# Patient Record
Sex: Male | Born: 1975 | Race: Black or African American | Hispanic: No | Marital: Married | Smoking: Former smoker
Health system: Southern US, Community
[De-identification: ages and names within clinical notes are randomized; demographics above are authoritative.]

## PROBLEM LIST (undated history)

## (undated) DIAGNOSIS — J45909 Unspecified asthma, uncomplicated: Secondary | ICD-10-CM

## (undated) DIAGNOSIS — G43909 Migraine, unspecified, not intractable, without status migrainosus: Secondary | ICD-10-CM

## (undated) DIAGNOSIS — M25559 Pain in unspecified hip: Secondary | ICD-10-CM

## (undated) DIAGNOSIS — I10 Essential (primary) hypertension: Secondary | ICD-10-CM

## (undated) DIAGNOSIS — E7800 Pure hypercholesterolemia, unspecified: Secondary | ICD-10-CM

## (undated) DIAGNOSIS — M549 Dorsalgia, unspecified: Secondary | ICD-10-CM

## (undated) DIAGNOSIS — M545 Low back pain, unspecified: Secondary | ICD-10-CM

## (undated) DIAGNOSIS — J4520 Mild intermittent asthma, uncomplicated: Secondary | ICD-10-CM

## (undated) HISTORY — DX: Pain in unspecified hip: M25.559

## (undated) HISTORY — PX: INCISION AND DRAINAGE: SHX5863

---

## 2011-05-02 ENCOUNTER — Emergency Department (HOSPITAL_COMMUNITY)
Admission: EM | Admit: 2011-05-02 | Discharge: 2011-05-03 | Disposition: A | Discharge status: Home / Self Care | Payer: Medicaid Other | Attending: Emergency Medicine | Admitting: Emergency Medicine

## 2011-05-02 ENCOUNTER — Encounter: Payer: Self-pay | Admitting: Emergency Medicine

## 2011-05-02 DIAGNOSIS — M549 Dorsalgia, unspecified: Secondary | ICD-10-CM | POA: Insufficient documentation

## 2011-05-02 DIAGNOSIS — G8929 Other chronic pain: Secondary | ICD-10-CM | POA: Insufficient documentation

## 2011-05-02 HISTORY — DX: Dorsalgia, unspecified: M54.9

## 2011-05-02 NOTE — ED Notes (Signed)
Pt alert, nad, c/o low back pain, chronic in nature, denies recent trauma or injury, ambulates to triage, PMS intact

## 2011-05-02 NOTE — ED Notes (Signed)
Pt states that he has chronic back pain that has began to hurt him again. Patient states he had numbness yesterday in his left leg and hip that has become painful today. Patient is also c/o pain in the back of his head and neck. Patient states that it hurts to walk a lot and can't lift his right leg very much. Patient takes flexeril for pain.

## 2011-05-03 MED ORDER — PREDNISONE 20 MG PO TABS
60.0000 mg | ORAL_TABLET | Freq: Once | ORAL | Status: AC
  Administered 2011-05-03: 60 mg via ORAL
  Filled 2011-05-03: qty 3

## 2011-05-03 MED ORDER — NAPROXEN 500 MG PO TABS: 500.0000 mg | ORAL_TABLET | Freq: Two times a day (BID) | ORAL | Status: AC

## 2011-05-03 MED ORDER — PREDNISONE 50 MG PO TABS: ORAL_TABLET | ORAL | Status: AC

## 2011-05-03 MED ORDER — ALBUTEROL SULFATE HFA 108 (90 BASE) MCG/ACT IN AERS: 2.0000 | INHALATION_SPRAY | RESPIRATORY_TRACT | Status: DC | PRN

## 2011-05-03 NOTE — ED Provider Notes (Signed)
History     CSN: [id number] Arrival date & time: 05/02/2011 10:16 PM   First MD Initiated Contact with Patient 05/02/11 2343      Chief Complaint  Patient presents with  . Back Pain    (Consider location/radiation/quality/duration/timing/severity/associated sxs/prior treatment) HPI  Patient states he recently moved to [place] from [place] and has not established primary care presents to emergency department complaining of chronic back pain stating that over the last 2-3 days a flare in his chronic back pain. Patient states the pain is similar to the flares of his chronic back pain in the past. Patient denies any numbness, tingling, or weakness of lower extremities, saddle seat paresthesias, loss of bowel or bladder function, abdominal pain, or urinary symptoms. Patient has not taken anything for pain at home prior to arrival. Patient states his primary care provider in [place] had talked about sending him to a back specialist for further evaluation of his back pain. Patient states he did not do this prior to moving to [place]. Patient states pain is aggravated by movement and radiates from left lower back into the left hip. Patient states pain is improved by lying still. Patient denies any new injury.  Patient is also requesting a refill of his albuterol inhaler stating he uses it on PRN basis and has run out. No wheezing or coughing complaints at this time Past Medical History  Diagnosis Date  . Back pain     History reviewed. No pertinent past surgical history.  No family history on file.  History  Substance Use Topics  . Smoking status: Not on file  . Smokeless tobacco: Not on file  . Alcohol Use:       Review of Systems  All other systems reviewed and are negative.    Allergies  Motrin  Home Medications   Current Outpatient Rx  Name Route Sig Dispense Refill  . ALBUTEROL SULFATE HFA 108 (90 BASE) MCG/ACT IN AERS Inhalation Inhale 2 puffs  into the lungs every 6 (six) hours as needed.      . ALBUTEROL SULFATE HFA 108 (90 BASE) MCG/ACT IN AERS Inhalation Inhale 2 puffs into the lungs every 4 (four) hours as needed for wheezing. 1 Inhaler 0  . NAPROXEN 500 MG PO TABS Oral Take 1 tablet (500 mg total) by mouth 2 (two) times daily. 30 tablet 0  . PREDNISONE 50 MG PO TABS  Take 1 tablet by mouth daily for total of 5 days. 5 tablet 0    BP 166/115  Pulse 101  Temp(Src) 98.9 F (37.2 C) (Oral)  Resp 18  SpO2 100%  Physical Exam  Nursing note and vitals reviewed. Constitutional: He is oriented to person, place, and time. He appears well-developed and well-nourished. No distress.  HENT:  Head: Normocephalic and atraumatic.  Eyes: Conjunctivae are normal.  Neck: Normal range of motion. Neck supple.  Cardiovascular: Normal rate, regular rhythm, normal heart sounds and intact distal pulses.  Exam reveals no gallop and no friction rub.   No murmur heard. Pulmonary/Chest: Effort normal and breath sounds normal. No respiratory distress. He has no wheezes. He has no rales. He exhibits no tenderness.  Abdominal: Bowel sounds are normal. He exhibits no distension and no mass. There is no tenderness. There is no rebound and no guarding.  Musculoskeletal: Normal range of motion. He exhibits tenderness. He exhibits no edema.       Mild TTP of soft tissue of left lower back but no skin changes or  crepitous.   Neurological: He is alert and oriented to person, place, and time. He has normal reflexes.  Skin: Skin is warm and dry. No rash noted. He is not diaphoretic. No erythema.  Psychiatric: He has a normal mood and affect.    ED Course  Procedures (including critical care time)  Labs Reviewed - No data to display No results found.   1. Chronic back pain       MDM  Patient with history of chronic back pain without any signs or symptoms of central cord compression or cauda equina. No injury. Patient is ambulating without  difficulty. Patient is neurovascularly intact in bilateral lower extremities with equal reflexes.        Jenness [place], [place] 05/03/11 [phone]

## 2011-05-03 NOTE — ED Notes (Signed)
Patient given discharge instructions, information, prescriptions, and diet order. Patient states that they adequately understand discharge information given and to return to ED if symptoms return or worsen.    Patient given 3 rx. -

## 2011-05-03 NOTE — ED Provider Notes (Signed)
Medical screening examination/treatment/procedure(s) were performed by non-physician practitioner and as supervising physician I was immediately available for consultation/collaboration.  Olivia Mackie, MD 05/03/11 [phone]

## 2012-09-07 ENCOUNTER — Emergency Department (HOSPITAL_COMMUNITY): Payer: Medicaid Other

## 2012-09-07 ENCOUNTER — Encounter (HOSPITAL_COMMUNITY): Payer: Self-pay | Admitting: *Deleted

## 2012-09-07 ENCOUNTER — Emergency Department (HOSPITAL_COMMUNITY)
Admission: EM | Admit: 2012-09-07 | Discharge: 2012-09-07 | Disposition: A | Discharge status: Home / Self Care | Payer: Medicaid Other | Attending: Emergency Medicine | Admitting: Emergency Medicine

## 2012-09-07 DIAGNOSIS — Z862 Personal history of diseases of the blood and blood-forming organs and certain disorders involving the immune mechanism: Secondary | ICD-10-CM | POA: Insufficient documentation

## 2012-09-07 DIAGNOSIS — R112 Nausea with vomiting, unspecified: Secondary | ICD-10-CM | POA: Insufficient documentation

## 2012-09-07 DIAGNOSIS — J45909 Unspecified asthma, uncomplicated: Secondary | ICD-10-CM | POA: Insufficient documentation

## 2012-09-07 DIAGNOSIS — I10 Essential (primary) hypertension: Secondary | ICD-10-CM | POA: Insufficient documentation

## 2012-09-07 DIAGNOSIS — Z8639 Personal history of other endocrine, nutritional and metabolic disease: Secondary | ICD-10-CM | POA: Insufficient documentation

## 2012-09-07 DIAGNOSIS — R079 Chest pain, unspecified: Secondary | ICD-10-CM | POA: Insufficient documentation

## 2012-09-07 DIAGNOSIS — Z79899 Other long term (current) drug therapy: Secondary | ICD-10-CM | POA: Insufficient documentation

## 2012-09-07 DIAGNOSIS — Z8679 Personal history of other diseases of the circulatory system: Secondary | ICD-10-CM | POA: Insufficient documentation

## 2012-09-07 DIAGNOSIS — G8929 Other chronic pain: Secondary | ICD-10-CM | POA: Insufficient documentation

## 2012-09-07 DIAGNOSIS — R0602 Shortness of breath: Secondary | ICD-10-CM | POA: Insufficient documentation

## 2012-09-07 HISTORY — DX: Unspecified asthma, uncomplicated: J45.909

## 2012-09-07 HISTORY — DX: Migraine, unspecified, not intractable, without status migrainosus: G43.909

## 2012-09-07 HISTORY — DX: Essential (primary) hypertension: I10

## 2012-09-07 HISTORY — DX: Pure hypercholesterolemia, unspecified: E78.00

## 2012-09-07 LAB — BASIC METABOLIC PANEL
BUN: 17 mg/dL (ref 6–23)
CO2: 26 mEq/L (ref 19–32)
Chloride: 104 mEq/L (ref 96–112)
GFR calc non Af Amer: >90 mL/min (ref >90)
Glucose, Bld: 93 mg/dL (ref 70–99)
Potassium: 3.8 mEq/L (ref 3.5–5.1)
Sodium: 139 mEq/L (ref 135–145)

## 2012-09-07 LAB — POCT I-STAT TROPONIN I: Troponin i, poc: 0 ng/mL (ref 0.00–0.08)

## 2012-09-07 LAB — CBC
HCT: 37.7 % — ABNORMAL LOW (ref 39.0–52.0)
Hemoglobin: 12.8 g/dL — ABNORMAL LOW (ref 13.0–17.0)
MCHC: 34 g/dL (ref 30.0–36.0)
RBC: 4.83 MIL/uL (ref 4.22–5.81)

## 2012-09-07 MED ORDER — ONDANSETRON HCL 4 MG/2ML IJ SOLN
4.0000 mg | Freq: Once | INTRAMUSCULAR | Status: AC
  Administered 2012-09-07: 4 mg via INTRAVENOUS
  Filled 2012-09-07: qty 2

## 2012-09-07 MED ORDER — MORPHINE SULFATE 4 MG/ML IJ SOLN
4.0000 mg | Freq: Once | INTRAMUSCULAR | Status: AC
  Administered 2012-09-07: 4 mg via INTRAVENOUS
  Filled 2012-09-07: qty 1

## 2012-09-07 NOTE — ED Notes (Signed)
Pt states he is dizzy, placed on fall precautions.

## 2012-09-07 NOTE — ED Provider Notes (Signed)
History     CSN: [id number]  Arrival date & time 09/07/12  0107   First MD Initiated Contact with Patient 09/07/12 0126      Chief Complaint  Patient presents with  . Chest Pain    (Consider location/radiation/quality/duration/timing/severity/associated sxs/prior treatment) HPI History provided by patient. Has chronic chest pain and seems to be worse over the last 4 days. Located left chest, burning in quality and not radiating. Intermittently has some shortness of breath and nausea with vomiting once earlier today. No trauma. No fevers or cough. Was previously followed by cardiology in [place] but now lives in [place] area - he does not have a local primary care physician. No history of MI. Pain moderate severity and is present daily, not related to exertion, with no known alleviating factors. No leg pain or swelling. Patient is requesting referrals to primary care clinic. He has not had any recent stress testing Past Medical History  Diagnosis Date  . Back pain   . Hypertension   . Hypercholesteremia   . Migraine   . Asthma     History reviewed. No pertinent past surgical history.  History reviewed. No pertinent family history.  History  Substance Use Topics  . Smoking status: Never Smoker   . Smokeless tobacco: Not on file  . Alcohol Use: Yes     Comment: wine every so often      Review of Systems  Constitutional: Negative for fever and chills.  HENT: Negative for neck pain and neck stiffness.   Eyes: Negative for pain.  Respiratory: Negative for cough.   Cardiovascular: Positive for chest pain.  Gastrointestinal: Negative for abdominal pain.  Genitourinary: Negative for dysuria.  Musculoskeletal: Negative for back pain.  Skin: Negative for rash.  Neurological: Negative for headaches.  All other systems reviewed and are negative.    Allergies  Flexeril; Motrin; and Naproxen  Home Medications   Current Outpatient Rx  Name  Route  Sig  Dispense  Refill   . albuterol (PROVENTIL HFA;VENTOLIN HFA) 108 (90 BASE) MCG/ACT inhaler   Inhalation   Inhale 2 puffs into the lungs every 6 (six) hours as needed.           Marland Kitchen EXPIRED: albuterol (PROVENTIL HFA;VENTOLIN HFA) 108 (90 BASE) MCG/ACT inhaler   Inhalation   Inhale 2 puffs into the lungs every 4 (four) hours as needed for wheezing.   1 Inhaler   0     BP 148/95  Pulse 111  Temp(Src) 98.7 F (37.1 C) (Oral)  Resp 16  SpO2 99%  Physical Exam  Constitutional: He is oriented to person, place, and time. He appears well-developed and well-nourished.  HENT:  Head: Normocephalic and atraumatic.  Eyes: Conjunctivae and EOM are normal. Pupils are equal, round, and reactive to light.  Neck: Trachea normal. Neck supple. No thyromegaly present.  Cardiovascular: Normal rate, regular rhythm, S1 normal, S2 normal, intact distal pulses and normal pulses.     No systolic murmur is present   No diastolic murmur is present  Pulses:      Radial pulses are 2+ on the right side, and 2+ on the left side.  Pulmonary/Chest: Effort normal and breath sounds normal. No respiratory distress. He has no wheezes. He has no rhonchi. He has no rales.  No rash or crepitus  Abdominal: Soft. Normal appearance and bowel sounds are normal. There is no tenderness. There is no CVA tenderness and negative Murphy's sign.  Musculoskeletal: Normal range of motion. He exhibits no  edema.  BLE:s Calves nontender, no cords or erythema, negative Homans sign  Neurological: He is alert and oriented to person, place, and time. He has normal strength. No cranial nerve deficit or sensory deficit. GCS eye subscore is 4. GCS verbal subscore is 5. GCS motor subscore is 6.  Skin: Skin is warm and dry. No rash noted. He is not diaphoretic.  Psychiatric: His speech is normal.  Cooperative and appropriate    ED Course  Procedures (including critical care time)   Results for orders placed during the hospital encounter of 09/07/12  CBC       Result Value Range   WBC 4.3  4.0 - 10.5 K/uL   RBC 4.83  4.22 - 5.81 MIL/uL   Hemoglobin 12.8 (*) 13.0 - 17.0 g/dL   HCT 16.1 (*) 09.6 - 04.5 %   MCV 78.1  78.0 - 100.0 fL   MCH 26.5  26.0 - 34.0 pg   MCHC 34.0  30.0 - 36.0 g/dL   RDW 40.9  81.1 - 91.4 %   Platelets 244  150 - 400 K/uL  BASIC METABOLIC PANEL      Result Value Range   Sodium 139  135 - 145 mEq/L   Potassium 3.8  3.5 - 5.1 mEq/L   Chloride 104  96 - 112 mEq/L   CO2 26  19 - 32 mEq/L   Glucose, Bld 93  70 - 99 mg/dL   BUN 17  6 - 23 mg/dL   Creatinine, Ser 7.82  0.50 - 1.35 mg/dL   Calcium 9.4  8.4 - 95.6 mg/dL   GFR calc non Af Amer >90  >90 mL/min   GFR calc Af Amer >90  >90 mL/min  TROPONIN I      Result Value Range   Troponin I <0.30  <0.30 ng/mL  POCT I-STAT TROPONIN I      Result Value Range   Troponin i, poc 0.00  0.00 - 0.08 ng/mL   Comment 3            Dg Chest 2 View  09/07/2012  *RADIOLOGY REPORT*  Clinical Data: 37 year old male chest pain and hypertension.  CHEST - 2 VIEW  Comparison: None.  Findings: Lung volumes at the upper limits of normal.  Cardiac size and mediastinal contours are within normal limits.  Visualized tracheal air column is within normal limits.  Incidental EKG button artifact.  No pneumothorax, pulmonary edema, pleural effusion or confluent pulmonary opacity. No acute osseous abnormality identified.  IMPRESSION: No acute cardiopulmonary abnormality.   Original Report Authenticated By: Erskine Speed, M.D.      Date: 09/07/2012  Rate: 100  Rhythm: normal sinus rhythm  QRS Axis: normal  Intervals: normal  ST/T Wave abnormalities: nonspecific ST changes  Conduction Disutrbances:none  Narrative Interpretation:   Old EKG Reviewed: none available  Aspirin held for multiple NSAID allergies. IV morphine with pain improved. Plan outpatient followup with referral for stress testing and follow up primary care clinic  MDM  Chronic chest pain, constant and worse or lasts 4  days  Evaluated with EKG, labs and chest x-ray all reviewed as above  Improved with IV narcotics.  Vital signs and nursing notes reviewed and considered      Sunnie Nielsen, MD 09/07/12 0425

## 2012-09-07 NOTE — ED Notes (Signed)
Pt c/o CP x 2 days, states he thinks his BP has been high.  C/o dizziness, SOB earlier, and n/v.

## 2012-09-07 NOTE — ED Notes (Signed)
No old EKG. New EKG given to DR Dierdre Highman.

## 2012-09-07 NOTE — ED Notes (Signed)
Pt discharged.Vital signs stable and GCS 15 

## 2012-09-23 ENCOUNTER — Other Ambulatory Visit: Payer: Self-pay

## 2012-09-23 ENCOUNTER — Emergency Department (HOSPITAL_COMMUNITY): Payer: Medicaid Other

## 2012-09-23 ENCOUNTER — Emergency Department (HOSPITAL_COMMUNITY)
Admission: EM | Admit: 2012-09-23 | Discharge: 2012-09-23 | Disposition: A | Discharge status: Home / Self Care | Payer: Medicaid Other | Attending: Emergency Medicine | Admitting: Emergency Medicine

## 2012-09-23 ENCOUNTER — Encounter (HOSPITAL_COMMUNITY): Payer: Self-pay | Admitting: Emergency Medicine

## 2012-09-23 DIAGNOSIS — J45909 Unspecified asthma, uncomplicated: Secondary | ICD-10-CM | POA: Insufficient documentation

## 2012-09-23 DIAGNOSIS — Z8679 Personal history of other diseases of the circulatory system: Secondary | ICD-10-CM | POA: Insufficient documentation

## 2012-09-23 DIAGNOSIS — I10 Essential (primary) hypertension: Secondary | ICD-10-CM | POA: Insufficient documentation

## 2012-09-23 DIAGNOSIS — Z862 Personal history of diseases of the blood and blood-forming organs and certain disorders involving the immune mechanism: Secondary | ICD-10-CM | POA: Insufficient documentation

## 2012-09-23 DIAGNOSIS — Z8639 Personal history of other endocrine, nutritional and metabolic disease: Secondary | ICD-10-CM | POA: Insufficient documentation

## 2012-09-23 DIAGNOSIS — R079 Chest pain, unspecified: Secondary | ICD-10-CM | POA: Insufficient documentation

## 2012-09-23 DIAGNOSIS — R6883 Chills (without fever): Secondary | ICD-10-CM | POA: Insufficient documentation

## 2012-09-23 DIAGNOSIS — G40909 Epilepsy, unspecified, not intractable, without status epilepticus: Secondary | ICD-10-CM | POA: Insufficient documentation

## 2012-09-23 DIAGNOSIS — R569 Unspecified convulsions: Secondary | ICD-10-CM

## 2012-09-23 LAB — RAPID URINE DRUG SCREEN, HOSP PERFORMED
Amphetamines: NOT DETECTED
Barbiturates: NOT DETECTED
Benzodiazepines: NOT DETECTED

## 2012-09-23 LAB — COMPREHENSIVE METABOLIC PANEL
AST: 31 U/L (ref 0–37)
Albumin: 4.8 g/dL (ref 3.5–5.2)
Alkaline Phosphatase: 48 U/L (ref 39–117)
BUN: 8 mg/dL (ref 6–23)
CO2: 24 mEq/L (ref 19–32)
Chloride: 99 mEq/L (ref 96–112)
GFR calc non Af Amer: >90 mL/min (ref >90)
Potassium: 3.6 mEq/L (ref 3.5–5.1)
Total Bilirubin: 0.3 mg/dL (ref 0.3–1.2)

## 2012-09-23 LAB — CBC WITH DIFFERENTIAL/PLATELET
Basophils Relative: 0 % (ref 0–1)
HCT: 39.1 % (ref 39.0–52.0)
Hemoglobin: 13.3 g/dL (ref 13.0–17.0)
Lymphocytes Relative: 32 % (ref 12–46)
MCHC: 34 g/dL (ref 30.0–36.0)
Monocytes Absolute: 0.3 10*3/uL (ref 0.1–1.0)
Monocytes Relative: 6 % (ref 3–12)
Neutro Abs: 2.8 10*3/uL (ref 1.7–7.7)
Neutrophils Relative %: 61 % (ref 43–77)
RBC: 5 MIL/uL (ref 4.22–5.81)
WBC: 4.6 10*3/uL (ref 4.0–10.5)

## 2012-09-23 LAB — POCT I-STAT TROPONIN I: Troponin i, poc: 0.02 ng/mL (ref 0.00–0.08)

## 2012-09-23 MED ORDER — DIAZEPAM 5 MG/ML IJ SOLN
5.0000 mg | Freq: Once | INTRAMUSCULAR | Status: AC
  Administered 2012-09-23: 5 mg via INTRAVENOUS
  Filled 2012-09-23: qty 2

## 2012-09-23 NOTE — ED Notes (Signed)
From [facility], c/o headache and chest pain, went outside for air, friend found him seizing on ground. When EMS arrived, not seizing and not answering questions. Started having "grand mal" seizures, answered EMS questions immediately after. Pt immobilized.

## 2012-09-23 NOTE — ED Provider Notes (Signed)
History     CSN: [id number]  Arrival date & time 09/23/12  0102   First MD Initiated Contact with Patient 09/23/12 [phone]      Chief Complaint  Patient presents with  . Seizures    (Consider location/radiation/quality/duration/timing/severity/associated sxs/prior treatment) HPI This is a 37 year old male with a remote history of a single seizure. He reportedly was having a headache and chest pain and went outside for air this morning. His friend found him having a generalized tonic-clonic seizure. This resolved and when EMS arrived he was able to answer questions. EMS reports that he began having "grand mal" seizures again. He was immobilized on a spine board with cervical collar.  The patient complains of chest pain and chills for 2 weeks and states he has had pain in his muscles all down his neck and back for about a week. He states these pains are severe. ED note from April 13 indicates he has a history of chronic back and chest pain.  He denies drugs or excessive alcohol use stating he only drinks red wine every now and then.  Past Medical History  Diagnosis Date  . Back pain   . Hypertension   . Hypercholesteremia   . Migraine   . Asthma     History reviewed. No pertinent past surgical history.  History reviewed. No pertinent family history.  History  Substance Use Topics  . Smoking status: Never Smoker   . Smokeless tobacco: Not on file  . Alcohol Use: Yes     Comment: wine every so often      Review of Systems  All other systems reviewed and are negative.    Allergies  Flexeril; Motrin; and Naproxen  Home Medications   Current Outpatient Rx  Name  Route  Sig  Dispense  Refill  . acetaminophen (TYLENOL) 325 MG tablet   Oral   Take 325 mg by mouth every 6 (six) hours as needed for pain.           BP 118/69  Pulse 88  Temp(Src) 99.2 F (37.3 C) (Oral)  Resp 14  SpO2 98%  Physical Exam General: Well-developed, well-nourished male in no acute  distress; appearance consistent with age of record HENT: normocephalic, atraumatic; edentulous Eyes: pupils equal round and reactive to light; extraocular muscles intact Neck: Immobilized in cervical collar; tender to palpation; trachea midline without dysphonia Heart: regular rate and rhythm Lungs: Normal respiratory effort and excursion Abdomen: soft; nondistended Back: Generalized tenderness Extremities: No deformity; full range of motion; pulses normal Neurologic: Awake, alert; motor function intact in all extremities and symmetric; no facial droop Skin: Warm and dry Psychiatric: Flat affect    ED Course  Procedures (including critical care time)     MDM   Nursing notes and vitals signs, including pulse oximetry, reviewed.  Summary of this visit's results, reviewed by myself:  Labs:  Results for orders placed during the hospital encounter of 09/23/12 (from the past 24 hour(s))  CBC WITH DIFFERENTIAL     Status: Abnormal   Collection Time    09/23/12  1:45 AM      Result Value Range   WBC 4.6  4.0 - 10.5 K/uL   RBC 5.00  4.22 - 5.81 MIL/uL   Hemoglobin 13.3  13.0 - 17.0 g/dL   HCT 64.4  03.4 - 74.2 %   MCV 78.2  78.0 - 100.0 fL   MCH 26.6  26.0 - 34.0 pg   MCHC 34.0  30.0 - 36.0  g/dL   RDW 16.1 (*) 09.6 - 04.5 %   Platelets 287  150 - 400 K/uL   Neutrophils Relative 61  43 - 77 %   Neutro Abs 2.8  1.7 - 7.7 K/uL   Lymphocytes Relative 32  12 - 46 %   Lymphs Abs 1.5  0.7 - 4.0 K/uL   Monocytes Relative 6  3 - 12 %   Monocytes Absolute 0.3  0.1 - 1.0 K/uL   Eosinophils Relative 0  0 - 5 %   Eosinophils Absolute 0.0  0.0 - 0.7 K/uL   Basophils Relative 0  0 - 1 %   Basophils Absolute 0.0  0.0 - 0.1 K/uL  COMPREHENSIVE METABOLIC PANEL     Status: None   Collection Time    09/23/12  1:45 AM      Result Value Range   Sodium 139  135 - 145 mEq/L   Potassium 3.6  3.5 - 5.1 mEq/L   Chloride 99  96 - 112 mEq/L   CO2 24  19 - 32 mEq/L   Glucose, Bld 81  70 - 99  mg/dL   BUN 8  6 - 23 mg/dL   Creatinine, Ser 4.09  0.50 - 1.35 mg/dL   Calcium 9.8  8.4 - 81.1 mg/dL   Total Protein 7.7  6.0 - 8.3 g/dL   Albumin 4.8  3.5 - 5.2 g/dL   AST 31  0 - 37 U/L   ALT 37  0 - 53 U/L   Alkaline Phosphatase 48  39 - 117 U/L   Total Bilirubin 0.3  0.3 - 1.2 mg/dL   GFR calc non Af Amer >90  >90 mL/min   GFR calc Af Amer >90  >90 mL/min  POCT I-STAT TROPONIN I     Status: None   Collection Time    09/23/12  1:55 AM      Result Value Range   Troponin i, poc 0.02  0.00 - 0.08 ng/mL   Comment 3           ETHANOL     Status: Abnormal   Collection Time    09/23/12  2:22 AM      Result Value Range   Alcohol, Ethyl (B) 52 (*) 0 - 11 mg/dL  CK     Status: Abnormal   Collection Time    09/23/12  2:22 AM      Result Value Range   Total CK 308 (*) 7 - 232 U/L  URINE RAPID DRUG SCREEN (HOSP PERFORMED)     Status: None   Collection Time    09/23/12  3:22 AM      Result Value Range   Opiates NONE DETECTED  NONE DETECTED   Cocaine NONE DETECTED  NONE DETECTED   Benzodiazepines NONE DETECTED  NONE DETECTED   Amphetamines NONE DETECTED  NONE DETECTED   Tetrahydrocannabinol NONE DETECTED  NONE DETECTED   Barbiturates NONE DETECTED  NONE DETECTED    Imaging Studies: Dg Chest 2 View  09/23/2012  *RADIOLOGY REPORT*  Clinical Data: Chest pain.  Seizures.  The patient fell and injured the back.  Upper and lower back pain.  CHEST - 2 VIEW  Comparison: 09/07/2012  Findings: The heart size and pulmonary vascularity are normal. The lungs appear clear and expanded without focal air space disease or consolidation. No blunting of the costophrenic angles.  No pneumothorax.  Mediastinal contours appear intact.  No acute changes since previous study.  IMPRESSION: No  evidence of active pulmonary disease.   Original Report Authenticated By: Burman Nieves, M.D.    Dg Thoracic Spine 2 View  09/23/2012  *RADIOLOGY REPORT*  Clinical Data: Seizure and fall, injuring the back.  Upper  back pain.  THORACIC SPINE - 2 VIEW  Comparison: None.  Findings: Normal alignment of the thoracic vertebrae.  No vertebral compression deformities.  Intervertebral disc space heights are preserved.  No paraspinal soft tissue swelling.  No focal bone lesion or bone destruction.  Bone cortex and trabecular architecture appear intact.  IMPRESSION: No acute displaced fractures demonstrated in the thoracic spine.   Original Report Authenticated By: Burman Nieves, M.D.    Dg Lumbar Spine Complete  09/23/2012  *RADIOLOGY REPORT*  Clinical Data: C year with fall, injuring the back.  Back pain.  LUMBAR SPINE - COMPLETE 4+ VIEW  Comparison: None.  Findings: Five lumbar type vertebrae.  Normal alignment of the lumbar vertebrae and facet joints.  No vertebral compression deformities.  Intervertebral disc space heights appear preserved. No focal bone lesion or bone destruction.  Bone cortex and trabecular architecture appear intact.  IMPRESSION: No displaced fractures demonstrated in the lumbar spine.   Original Report Authenticated By: Burman Nieves, M.D.    Ct Head Wo Contrast  09/23/2012  *RADIOLOGY REPORT*  Clinical Data:  The patient complained of headache and subsequent had a seizure.  The patient fell to the ground.  CT HEAD WITHOUT CONTRAST CT CERVICAL SPINE WITHOUT CONTRAST  Technique:  Multidetector CT imaging of the head and cervical spine was performed following the standard protocol without intravenous contrast.  Multiplanar CT image reconstructions of the cervical spine were also generated.  Comparison:  None.  CT HEAD  Findings: The ventricles and sulci are symmetrical without significant effacement, displacement, or dilatation. No mass effect or midline shift. No abnormal extra-axial fluid collections. The grey-white matter junction is distinct. Basal cisterns are not effaced. No acute intracranial hemorrhage. No depressed skull fractures.  Visualized paranasal sinuses and mastoid air cells are not  opacified.  IMPRESSION: No acute intracranial abnormalities.  CT CERVICAL SPINE  Findings: Straightening of the usual cervical lordosis which may be due to patient positioning but ligamentous injury or muscle spasm are not excluded.  Lateral masses of C1 appear symmetrical.  The odontoid process appears intact.  No vertebral compression deformities.  Intervertebral disc space heights are preserved.  No prevertebral soft tissue swelling.  Tiny osseous fragment anterior to the C6-7 disc space is likely to represent old ununited ossicle. No focal bone lesion or bone destruction.  Bone cortex and trabecular architecture appear intact.  IMPRESSION: Straightening of the usual cervical lordosis.  No displaced cervical fractures identified.   Original Report Authenticated By: Burman Nieves, M.D.    Ct Cervical Spine Wo Contrast  09/23/2012  *RADIOLOGY REPORT*  Clinical Data:  The patient complained of headache and subsequent had a seizure.  The patient fell to the ground.  CT HEAD WITHOUT CONTRAST CT CERVICAL SPINE WITHOUT CONTRAST  Technique:  Multidetector CT imaging of the head and cervical spine was performed following the standard protocol without intravenous contrast.  Multiplanar CT image reconstructions of the cervical spine were also generated.  Comparison:  None.  CT HEAD  Findings: The ventricles and sulci are symmetrical without significant effacement, displacement, or dilatation. No mass effect or midline shift. No abnormal extra-axial fluid collections. The grey-white matter junction is distinct. Basal cisterns are not effaced. No acute intracranial hemorrhage. No depressed skull fractures.  Visualized paranasal sinuses  and mastoid air cells are not opacified.  IMPRESSION: No acute intracranial abnormalities.  CT CERVICAL SPINE  Findings: Straightening of the usual cervical lordosis which may be due to patient positioning but ligamentous injury or muscle spasm are not excluded.  Lateral masses of C1  appear symmetrical.  The odontoid process appears intact.  No vertebral compression deformities.  Intervertebral disc space heights are preserved.  No prevertebral soft tissue swelling.  Tiny osseous fragment anterior to the C6-7 disc space is likely to represent old ununited ossicle. No focal bone lesion or bone destruction.  Bone cortex and trabecular architecture appear intact.  IMPRESSION: Straightening of the usual cervical lordosis.  No displaced cervical fractures identified.   Original Report Authenticated By: Burman Nieves, M.D.     EKG Interpretation:  Date & Time: 09/23/2012 1:58 AM  Rate: 91  Rhythm: normal sinus rhythm  QRS Axis: normal  Intervals: normal  ST/T Wave abnormalities: normal  Conduction Disutrbances:none  Narrative Interpretation: LAH, LVH  Old EKG Reviewed: unchanged  4:24 AM Patient advised that under state law he is not permitted to drive for the next 6 months or until cleared by a specialist.  We will refer to neurology for followup.         Hanley Seamen, MD 09/23/12 [phone]

## 2012-09-23 NOTE — ED Notes (Signed)
Bed:WA14<BR> Expected date:<BR> Expected time:<BR> Means of arrival:<BR> Comments:<BR>

## 2012-09-23 NOTE — ED Notes (Signed)
Bed:WA07<BR> Expected date:<BR> Expected time:<BR> Means of arrival:<BR> Comments:<BR>

## 2012-09-23 NOTE — ED Notes (Signed)
Pt c/o chest pain and chills x2 weeks. Denies cough, denies SOB. Pt c/o back pain from base of neck to lower back, onset 1 week ago.

## 2012-09-23 NOTE — ED Notes (Signed)
ZOX:[id number]<AV> Expected date:09/23/12<BR> Expected time:12:55 AM<BR> Means of arrival:Ambulance<BR> Comments:<BR> seizure

## 2013-06-03 ENCOUNTER — Encounter (HOSPITAL_COMMUNITY): Payer: Self-pay | Admitting: Emergency Medicine

## 2013-06-03 ENCOUNTER — Emergency Department (HOSPITAL_COMMUNITY)
Admission: EM | Admit: 2013-06-03 | Discharge: 2013-06-04 | Disposition: A | Discharge status: Home / Self Care | Payer: Medicaid Other | Attending: Emergency Medicine | Admitting: Emergency Medicine

## 2013-06-03 DIAGNOSIS — B356 Tinea cruris: Secondary | ICD-10-CM | POA: Insufficient documentation

## 2013-06-03 NOTE — ED Notes (Signed)
Patient presents with "bumps" in the groin.  Has been shaving his groin.

## 2013-06-04 MED ORDER — CLOTRIMAZOLE-BETAMETHASONE 1-0.05 % EX CREA: 1.0000 "application " | TOPICAL_CREAM | Freq: Two times a day (BID) | CUTANEOUS | Status: DC

## 2013-06-04 NOTE — ED Provider Notes (Signed)
CSN: [id number]     Arrival date & time 06/03/13  2317 History   First MD Initiated Contact with Patient 06/03/13 2344     Chief Complaint  Patient presents with  . Rash   (Consider location/radiation/quality/duration/timing/severity/associated sxs/prior Treatment) HPI History provided by pt.   Pt has had pruritis of groin for the past 2 weeks.  Sx started two days after shaving his groin and he believes he has razor burn.  Associated w/ dull aching in perineum that is aggravated by standing.  OTC anti-fungal cream has given him relief of both pain and pruritis.  Denies fever, abd pain, testicular pain, urethral discharge and dysuria.  Was evaluated by an urgent care in [place], given a single dose of abx and told to f/u when he returned home.  Immunocompetent. Past Medical History  Diagnosis Date  . Back pain   . Hypertension   . Hypercholesteremia   . Migraine   . Asthma    History reviewed. No pertinent past surgical history. History reviewed. No pertinent family history. History  Substance Use Topics  . Smoking status: Never Smoker   . Smokeless tobacco: Not on file  . Alcohol Use: Yes     Comment: wine every so often    Review of Systems  All other systems reviewed and are negative.    Allergies  Flexeril; Motrin; and Naproxen  Home Medications   Current Outpatient Rx  Name  Route  Sig  Dispense  Refill  . acetaminophen (TYLENOL) 325 MG tablet   Oral   Take 325 mg by mouth every 6 (six) hours as needed for pain.         . clotrimazole-betamethasone (LOTRISONE) cream   Topical   Apply 1 application topically 2 (two) times daily.   30 g   0    BP 153/99  Pulse 120  Temp(Src) 98.5 F (36.9 C) (Oral)  Resp 18  Ht 5\' 8"  (1.727 m)  Wt 118 lb 4.8 oz (53.661 kg)  BMI 17.99 kg/m2  SpO2 97% Physical Exam  Nursing note and vitals reviewed. Constitutional: He is oriented to person, place, and time. He appears well-developed and well-nourished. No distress.  HENT:   Head: Normocephalic and atraumatic.  Eyes:  Normal appearance  Neck: Normal range of motion.  Pulmonary/Chest: Effort normal.  Musculoskeletal: Normal range of motion.  Neurological: He is alert and oriented to person, place, and time.  Skin:  Well demarcated hyperpigmentation of bilateral groin.  No erythema or skin lesions of perineum, scrotum or groin.  Very mild tenderness of perineum only.  No urethral discharge.  No inguinal lymphadenopathy.    Psychiatric: He has a normal mood and affect. His behavior is normal.    ED Course  Procedures (including critical care time) Labs Review Labs Reviewed - No data to display Imaging Review No results found.  EKG Interpretation   None       MDM   1. Tinea cruris    38yo immunocompetent M presents w/ groin pruritis and dull aching pain x 2 weeks.  Both pain and pruritis have been relieved by OTC anti-fungal cream. Exam most consistent w/ tinea cruris.  Doubt bacterial infection.  Pt prescribed clotrimazole and advised to return immediately for worsening pain, fever or spreading rash.     Otilio Miuatherine E Kyriakos Babler, PA-C 06/04/13 0102  Arie Sabinaatherine E Jashon Ishida, PA-C 06/04/13 [phone]

## 2013-06-04 NOTE — Discharge Instructions (Signed)
Use clotrimazole cream as prescribed.  Take tylenol or motrin as needed for pain.  Avoid tightly fitted clothing and continue to use powder to keep groin dry.  Please return to the ER immediately if the pain worsens or you develop fever or spreading of rash.    Jock Itch Jock itch is a germ infection of the groin and upper thighs. The type of germ that causes jock itch is a fungus. It is itchy and often feels like it is burning. It is common in people who play sports. Sweating and wearing certain athletic gear can cause this type of rash. HOME CARE  Take your medicines as told. Finish them even if you start to feel better.  Wear loose-fitting clothing.  Men should wear cotton boxer shorts.  Women should wear cotton underwear.  Avoid hot baths.  Dry the groin area well after bathing. GET HELP RIGHT AWAY IF:   Your rash is worse or spreading.  Your rash returns after treatment is finished.  Your rash is not gone in 4 weeks.  The area becomes red, warm, tender, and puffy (swollen).  You have a fever. MAKE SURE YOU:  Understand these instructions.  Will watch your condition.  Will get help right away if you are not doing well or get worse. Document Released: 08/08/2009 Document Revised: 08/06/2011 Document Reviewed: 08/08/2009 [facility] Patient Information 2014 [place], [place].

## 2013-06-05 NOTE — ED Provider Notes (Signed)
Medical screening examination/treatment/procedure(s) were performed by non-physician practitioner and as supervising physician I was immediately available for consultation/collaboration.  EKG Interpretation   None         Shaasia Odle, MD 06/05/13 0735 

## 2013-09-04 ENCOUNTER — Encounter (HOSPITAL_COMMUNITY): Payer: Self-pay | Admitting: Emergency Medicine

## 2013-09-04 ENCOUNTER — Emergency Department (HOSPITAL_COMMUNITY)
Admission: EM | Admit: 2013-09-04 | Discharge: 2013-09-04 | Disposition: A | Discharge status: Home / Self Care | Payer: Medicaid Other | Attending: Emergency Medicine | Admitting: Emergency Medicine

## 2013-09-04 ENCOUNTER — Emergency Department (HOSPITAL_COMMUNITY): Payer: Medicaid Other

## 2013-09-04 DIAGNOSIS — IMO0002 Reserved for concepts with insufficient information to code with codable children: Secondary | ICD-10-CM | POA: Insufficient documentation

## 2013-09-04 DIAGNOSIS — S6980XA Other specified injuries of unspecified wrist, hand and finger(s), initial encounter: Secondary | ICD-10-CM | POA: Insufficient documentation

## 2013-09-04 DIAGNOSIS — Y939 Activity, unspecified: Secondary | ICD-10-CM | POA: Insufficient documentation

## 2013-09-04 DIAGNOSIS — J45909 Unspecified asthma, uncomplicated: Secondary | ICD-10-CM | POA: Insufficient documentation

## 2013-09-04 DIAGNOSIS — I10 Essential (primary) hypertension: Secondary | ICD-10-CM | POA: Insufficient documentation

## 2013-09-04 DIAGNOSIS — S6990XA Unspecified injury of unspecified wrist, hand and finger(s), initial encounter: Secondary | ICD-10-CM

## 2013-09-04 DIAGNOSIS — M5416 Radiculopathy, lumbar region: Secondary | ICD-10-CM

## 2013-09-04 DIAGNOSIS — X58XXXA Exposure to other specified factors, initial encounter: Secondary | ICD-10-CM | POA: Insufficient documentation

## 2013-09-04 DIAGNOSIS — Y929 Unspecified place or not applicable: Secondary | ICD-10-CM | POA: Insufficient documentation

## 2013-09-04 LAB — COMPREHENSIVE METABOLIC PANEL
ALT: 13 U/L (ref 0–53)
AST: 14 U/L (ref 0–37)
Albumin: 4.9 g/dL (ref 3.5–5.2)
Alkaline Phosphatase: 40 U/L (ref 39–117)
BUN: 13 mg/dL (ref 6–23)
CHLORIDE: 100 meq/L (ref 96–112)
CO2: 26 mEq/L (ref 19–32)
Calcium: 10 mg/dL (ref 8.4–10.5)
Creatinine, Ser: 1.05 mg/dL (ref 0.50–1.35)
GFR, EST NON AFRICAN AMERICAN: 88 mL/min — AB (ref >90)
Glucose, Bld: 105 mg/dL — ABNORMAL HIGH (ref 70–99)
POTASSIUM: 4.4 meq/L (ref 3.7–5.3)
SODIUM: 141 meq/L (ref 137–147)
TOTAL PROTEIN: 7.8 g/dL (ref 6.0–8.3)
Total Bilirubin: <0.2 mg/dL — ABNORMAL LOW (ref 0.3–1.2)

## 2013-09-04 LAB — CBC WITH DIFFERENTIAL/PLATELET
BASOS ABS: 0 10*3/uL (ref 0.0–0.1)
BASOS PCT: 1 % (ref 0–1)
EOS ABS: 0.1 10*3/uL (ref 0.0–0.7)
Eosinophils Relative: 2 % (ref 0–5)
HCT: 40.4 % (ref 39.0–52.0)
HEMOGLOBIN: 13.4 g/dL (ref 13.0–17.0)
Lymphocytes Relative: 47 % — ABNORMAL HIGH (ref 12–46)
Lymphs Abs: 1.9 10*3/uL (ref 0.7–4.0)
MCH: 27.6 pg (ref 26.0–34.0)
MCHC: 33.2 g/dL (ref 30.0–36.0)
MCV: 83.1 fL (ref 78.0–100.0)
MONOS PCT: 4 % (ref 3–12)
Monocytes Absolute: 0.2 10*3/uL (ref 0.1–1.0)
NEUTROS ABS: 1.9 10*3/uL (ref 1.7–7.7)
NEUTROS PCT: 46 % (ref 43–77)
PLATELETS: 282 10*3/uL (ref 150–400)
RBC: 4.86 MIL/uL (ref 4.22–5.81)
RDW: 15.3 % (ref 11.5–15.5)
WBC: 4.1 10*3/uL (ref 4.0–10.5)

## 2013-09-04 LAB — URINALYSIS, ROUTINE W REFLEX MICROSCOPIC
BILIRUBIN URINE: NEGATIVE
Glucose, UA: NEGATIVE mg/dL
Hgb urine dipstick: NEGATIVE
Ketones, ur: NEGATIVE mg/dL
Leukocytes, UA: NEGATIVE
NITRITE: NEGATIVE
Protein, ur: NEGATIVE mg/dL
SPECIFIC GRAVITY, URINE: 1.024 (ref 1.005–1.030)
UROBILINOGEN UA: 0.2 mg/dL (ref 0.0–1.0)
pH: 6 (ref 5.0–8.0)

## 2013-09-04 MED ORDER — HYDROCHLOROTHIAZIDE 25 MG PO TABS: 25.0000 mg | ORAL_TABLET | Freq: Every day | ORAL | Status: DC

## 2013-09-04 MED ORDER — PREDNISONE 50 MG PO TABS: 50.0000 mg | ORAL_TABLET | Freq: Every day | ORAL | Status: DC

## 2013-09-04 MED ORDER — HYDROCODONE-ACETAMINOPHEN 5-325 MG PO TABS: 1.0000 | ORAL_TABLET | Freq: Four times a day (QID) | ORAL | Status: DC | PRN

## 2013-09-04 NOTE — Discharge Instructions (Signed)
Return here as needed.  Followup with the neurosurgeon provided for recheck.  Use ice and heat on your lower back

## 2013-09-04 NOTE — ED Notes (Signed)
Patient transported to X-ray 

## 2013-09-04 NOTE — ED Notes (Signed)
States he has had pain in entire L side of his body for past week. He said his stomach and chest do not hurt. He noticed some loose stool this week. Denies n/v.

## 2013-09-04 NOTE — ED Provider Notes (Signed)
CSN: [id number]     Arrival date & time 09/04/13  1327 History   First MD Initiated Contact with Patient 09/04/13 1638     Chief Complaint  Patient presents with  . Leg Pain     (Consider location/radiation/quality/duration/timing/severity/associated sxs/prior Treatment) HPI Patient presents to the emergency department with pain to the entire left side of his body.  The past week.  The patient's having lower back pain that radiates into his left leg.  Patient denies chest pain, shortness of breath, nausea, vomiting, abdominal pain, headache, blurred vision, weakness, numbness, dizziness, back pain, neck pain, rash, cough, sore throat, or syncope.  Patient, states, that he has a history of high blood pressure, and states he is always tachycardic.  The patient denies any problems urinating.  The patient, states, that he has not been taking his blood pressure medicines on a regular basis and needs refills.  The patient's main complaint today, however, is his low back pain that radiates into his left lower extremity. Past Medical History  Diagnosis Date  . Back pain   . Hypertension   . Hypercholesteremia   . Migraine   . Asthma    History reviewed. No pertinent past surgical history. History reviewed. No pertinent family history. History  Substance Use Topics  . Smoking status: Never Smoker   . Smokeless tobacco: Not on file  . Alcohol Use: Yes     Comment: wine every so often    Review of Systems   All other systems negative except as documented in the HPI. All pertinent positives and negatives as reviewed in the HPI. Allergies  Flexeril; Motrin; and Naproxen  Home Medications   Current Outpatient Rx  Name  Route  Sig  Dispense  Refill  . acetaminophen (TYLENOL) 325 MG tablet   Oral   Take 325 mg by mouth every 6 (six) hours as needed for pain.          BP 163/93  Pulse 125  Temp(Src) 98.8 F (37.1 C) (Oral)  Resp 18  SpO2 99% Physical Exam  Nursing note and  vitals reviewed. Constitutional: He is oriented to person, place, and time. He appears well-developed and well-nourished.  HENT:  Head: Normocephalic and atraumatic.  Mouth/Throat: Oropharynx is clear and moist.  Eyes: Pupils are equal, round, and reactive to light.  Neck: Normal range of motion. Neck supple.  Cardiovascular: Normal rate, regular rhythm and normal heart sounds.  Exam reveals no gallop and no friction rub.   No murmur heard. Pulmonary/Chest: Effort normal and breath sounds normal. No respiratory distress.  Abdominal: Soft. Bowel sounds are normal. He exhibits no distension. There is no tenderness.  Musculoskeletal:       Lumbar back: He exhibits tenderness and pain. He exhibits normal range of motion, no bony tenderness, no swelling, no edema, no deformity, no spasm and normal pulse.       Back:  Neurological: He is alert and oriented to person, place, and time. He has normal reflexes. He exhibits normal muscle tone. Coordination normal.  Skin: Skin is warm and dry. No rash noted. No erythema.    ED Course  Procedures (including critical care time) Labs Review Labs Reviewed  CBC WITH DIFFERENTIAL - Abnormal; Notable for the following:    Lymphocytes Relative 47 (*)    All other components within normal limits  COMPREHENSIVE METABOLIC PANEL - Abnormal; Notable for the following:    Glucose, Bld 105 (*)    Total Bilirubin <0.2 (*)  GFR calc non Af Amer 88 (*)    All other components within normal limits  URINALYSIS, ROUTINE W REFLEX MICROSCOPIC   Imaging Review Dg Finger Index Left  09/04/2013   CLINICAL DATA:  Finger pain, possible infection. Injury 1 week prior.  EXAM: LEFT INDEX FINGER 2+V  COMPARISON:  None available for comparison at time of study interpretation.  FINDINGS: There is no evidence of fracture or dislocation. There is no evidence of arthropathy or other focal bone abnormality. Second distal phalanx soft tissue swelling without subcutaneous gas,  radiopaque foreign bodies.  IMPRESSION: Distal second phalanx soft tissue swelling without radiopaque foreign bodies, subcutaneous fracture deformity or dislocation.   Electronically Signed   By: Awilda Metroourtnay  Bloomer   On: 09/04/2013 18:34   Patient has been stable here in the emergency department.  He did not have any signs of endorgan damage.  On exam or in his history of present illness.  The patient is advised to return here as needed.  Also given [facility].  Patient is given his hydrochlorothiazide is advised he may need further control of his blood pressure.  The patient has lumbar radiculopathy.  Most consistent with sciatica.  The patient has been stable otherwise here in the emergency department    Carlyle Dollyhristopher W Maeleigh Buschman, PA-C 09/09/13 0130

## 2013-09-09 NOTE — ED Provider Notes (Signed)
Medical screening examination/treatment/procedure(s) were performed by non-physician practitioner and as supervising physician I was immediately available for consultation/collaboration.   EKG Interpretation   Date/Time:  Friday September 04 2013 17:29:20 EDT Ventricular Rate:  101 PR Interval:  137 QRS Duration: 83 QT Interval:  336 QTC Calculation: 435 R Axis:   84 Text Interpretation:  Sinus tachycardia Probable left atrial enlargement  Probable left ventricular hypertrophy Anterior Q waves, possibly due to  [facility] ED PHYSICIAN INTERPRETATION AVAILABLE IN [facility] Confirmed by  TEST, Record ([id number]) on 09/06/2013 9:01:58 AM       Ethelda ChickMartha K Linker, MD 09/09/13 [phone]

## 2013-09-20 ENCOUNTER — Encounter (HOSPITAL_COMMUNITY): Payer: Self-pay | Admitting: Emergency Medicine

## 2013-09-20 ENCOUNTER — Emergency Department (HOSPITAL_COMMUNITY)
Admission: EM | Admit: 2013-09-20 | Discharge: 2013-09-20 | Disposition: A | Discharge status: Home / Self Care | Payer: Medicaid Other | Attending: Emergency Medicine | Admitting: Emergency Medicine

## 2013-09-20 DIAGNOSIS — Z8669 Personal history of other diseases of the nervous system and sense organs: Secondary | ICD-10-CM | POA: Insufficient documentation

## 2013-09-20 DIAGNOSIS — M25519 Pain in unspecified shoulder: Secondary | ICD-10-CM | POA: Insufficient documentation

## 2013-09-20 DIAGNOSIS — R142 Eructation: Secondary | ICD-10-CM

## 2013-09-20 DIAGNOSIS — G8929 Other chronic pain: Secondary | ICD-10-CM | POA: Insufficient documentation

## 2013-09-20 DIAGNOSIS — R141 Gas pain: Secondary | ICD-10-CM | POA: Insufficient documentation

## 2013-09-20 DIAGNOSIS — R143 Flatulence: Secondary | ICD-10-CM

## 2013-09-20 DIAGNOSIS — M25511 Pain in right shoulder: Secondary | ICD-10-CM

## 2013-09-20 DIAGNOSIS — Z862 Personal history of diseases of the blood and blood-forming organs and certain disorders involving the immune mechanism: Secondary | ICD-10-CM | POA: Insufficient documentation

## 2013-09-20 DIAGNOSIS — IMO0002 Reserved for concepts with insufficient information to code with codable children: Secondary | ICD-10-CM | POA: Insufficient documentation

## 2013-09-20 DIAGNOSIS — I10 Essential (primary) hypertension: Secondary | ICD-10-CM | POA: Insufficient documentation

## 2013-09-20 DIAGNOSIS — Z79899 Other long term (current) drug therapy: Secondary | ICD-10-CM | POA: Insufficient documentation

## 2013-09-20 DIAGNOSIS — J45909 Unspecified asthma, uncomplicated: Secondary | ICD-10-CM | POA: Insufficient documentation

## 2013-09-20 DIAGNOSIS — Z8639 Personal history of other endocrine, nutritional and metabolic disease: Secondary | ICD-10-CM | POA: Insufficient documentation

## 2013-09-20 MED ORDER — ALBUTEROL SULFATE HFA 108 (90 BASE) MCG/ACT IN AERS: 1.0000 | INHALATION_SPRAY | Freq: Four times a day (QID) | RESPIRATORY_TRACT | Status: DC | PRN

## 2013-09-20 MED ORDER — HYDROCODONE-ACETAMINOPHEN 5-325 MG PO TABS: 2.0000 | ORAL_TABLET | ORAL | Status: DC | PRN

## 2013-09-20 MED ORDER — ALBUTEROL SULFATE HFA 108 (90 BASE) MCG/ACT IN AERS
2.0000 | INHALATION_SPRAY | RESPIRATORY_TRACT | Status: DC
  Administered 2013-09-20: 2 via RESPIRATORY_TRACT
  Filled 2013-09-20 (×2): qty 6.7

## 2013-09-20 NOTE — Progress Notes (Addendum)
ED CM [facility] prescription and fill only pain medication. Explained that we cannot authorize such practice Pharmacist verbalized understanding.

## 2013-09-20 NOTE — Discharge Instructions (Signed)
Arthralgia Arthralgia is joint pain. A joint is a place where two bones meet. Joint pain can happen for many reasons. The joint can be bruised, stiff, infected, or weak from aging. Pain usually goes away after resting and taking medicine for soreness.  HOME CARE  Rest the joint as told by your doctor.  Keep the sore joint raised (elevated) for the first 24 hours.  Put ice on the joint area.  Put ice in a plastic bag.  Place a towel between your skin and the bag.  Leave the ice on for 15-20 minutes, 03-04 times a day.  Wear your splint, casting, elastic bandage, or sling as told by your doctor.  Only take medicine as told by your doctor. Do not take aspirin.  Use crutches as told by your doctor. Do not put weight on the joint until told to by your doctor. GET HELP RIGHT AWAY IF:   You have bruising, puffiness (swelling), or more pain.  Your fingers or toes turn blue or start to lose feeling (numb).  Your medicine does not lessen the pain.  Your pain becomes severe.  You have a temperature by mouth above 102 F (38.9 C), not controlled by medicine.  You cannot move or use the joint. MAKE SURE YOU:   Understand these instructions.  Will watch your condition.  Will get help right away if you are not doing well or get worse. Document Released: 05/02/2009 Document Revised: 08/06/2011 Document Reviewed: 05/02/2009 [facility] Patient Information 2014 [place] RunExitCare, [place].  Arthralgia Your caregiver has diagnosed you as suffering from an arthralgia. Arthralgia means there is pain in a joint. This can come from many reasons including:  Bruising the joint which causes soreness (inflammation) in the joint.  Wear and tear on the joints which occur as we grow older (osteoarthritis).  Overusing the joint.  Various forms of arthritis.  Infections of the joint. Regardless of the cause of pain in your joint, most of these different pains respond to anti-inflammatory drugs and rest.  The exception to this is when a joint is infected, and these cases are treated with antibiotics, if it is a bacterial infection. HOME CARE INSTRUCTIONS   Rest the injured area for as long as directed by your caregiver. Then slowly start using the joint as directed by your caregiver and as the pain allows. Crutches as directed may be useful if the ankles, knees or hips are involved. If the knee was splinted or casted, continue use and care as directed. If an stretchy or elastic wrapping bandage has been applied today, it should be removed and re-applied every 3 to 4 hours. It should not be applied tightly, but firmly enough to keep swelling down. Watch toes and feet for swelling, bluish discoloration, coldness, numbness or excessive pain. If any of these problems (symptoms) occur, remove the ace bandage and re-apply more loosely. If these symptoms persist, contact your caregiver or return to this location.  For the first 24 hours, keep the injured extremity elevated on pillows while lying down.  Apply ice for 15-20 minutes to the sore joint every couple hours while awake for the first half day. Then 03-04 times per day for the first 48 hours. Put the ice in a plastic bag and place a towel between the bag of ice and your skin.  Wear any splinting, casting, elastic bandage applications, or slings as instructed.  Only take over-the-counter or prescription medicines for pain, discomfort, or fever as directed by your caregiver. Do not  use aspirin immediately after the injury unless instructed by your physician. Aspirin can cause increased bleeding and bruising of the tissues.  If you were given crutches, continue to use them as instructed and do not resume weight bearing on the sore joint until instructed. Persistent pain and inability to use the sore joint as directed for more than 2 to 3 days are warning signs indicating that you should see a caregiver for a follow-up visit as soon as possible. Initially,  a hairline fracture (break in bone) may not be evident on X-rays. Persistent pain and swelling indicate that further evaluation, non-weight bearing or use of the joint (use of crutches or slings as instructed), or further X-rays are indicated. X-rays may sometimes not show a small fracture until a week or 10 days later. Make a follow-up appointment with your own caregiver or one to whom we have referred you. A radiologist (specialist in reading X-rays) may read your X-rays. Make sure you know how you are to obtain your X-ray results. Do not assume everything is normal if you do not hear from us. SEEK MEDICAL CARE IF: Bruising, swelling, or pain increases. SEEK IMMEDIATE MEDICAL CARE IF:   Your fingers or toes are numb or blue.  The pain is not responding to medications and continues to stay the same or get worse.  The pain in your joint becomes severe.  You develop a fever over 102 F (38.9 C).  It becomes impossible to move or use the joint. MAKE SURE YOU:   Understand these instructions.  Will watch your condition.  Will get help right away if you are not doing well or get worse. Document Released: 05/14/2005 Document Revised: 08/06/2011 Document Reviewed: 12/31/2007 [facility] Patient Information 2014 [place], [place].

## 2013-09-20 NOTE — ED Notes (Signed)
Pt's BP and HR are both elevated here in ED, pt states he has not taken his prescribed high blood pressure medicine in several months due to the medicine makes him nauseated, itching, and irritable. Pt requesting a new prescription for high BP. Pt has an appointment with a pcp July 8th Pt also reports using a friends albuterol inhaler prior to arrival to ED Pt was recently seen here for the same and prescribed Prednisone on 09/04/2013, pt denies having it filled due to having some at home, pt states he is taking Prednisone.  Pt also reports he used to "I used to catch the seizures about 8 months ago but I havent had any in a while."

## 2013-09-20 NOTE — ED Provider Notes (Signed)
CSN: [id number]     Arrival date & time 09/20/13  1130 History   First MD Initiated Contact with Patient 09/20/13 1157     Chief Complaint  Patient presents with  . Asthma  . Arm Pain     (Consider location/radiation/quality/duration/timing/severity/associated sxs/prior Treatment) Patient is a 38 y.o. male presenting with asthma and arm pain. The history is provided by the patient. No language interpreter was used.  Asthma This is a recurrent problem. The current episode started 1 to 4 weeks ago. The problem occurs intermittently. The problem has been unchanged. Associated symptoms include coughing. Nothing aggravates the symptoms. He has tried nothing for the symptoms. The treatment provided no relief.  Arm Pain This is a chronic problem. The current episode started more than 1 month ago. The problem occurs constantly. The problem has been gradually worsening. Associated symptoms include coughing. Nothing aggravates the symptoms. He has tried nothing for the symptoms. The treatment provided no relief.  Pt was in Pt for shoulder problems.  Pt complains of pain.  Pt ran out of albuterol  Past Medical History  Diagnosis Date  . Back pain   . Hypertension   . Hypercholesteremia   . Migraine   . Asthma    History reviewed. No pertinent past surgical history. History reviewed. No pertinent family history. History  Substance Use Topics  . Smoking status: Never Smoker   . Smokeless tobacco: Not on file  . Alcohol Use: Yes     Comment: wine every so often    Review of Systems  Respiratory: Positive for cough.   All other systems reviewed and are negative.     Allergies  Flexeril; Motrin; and Naproxen  Home Medications   Prior to Admission medications   Medication Sig Start Date End Date Taking? Authorizing Provider  acetaminophen (TYLENOL) 325 MG tablet Take 325 mg by mouth every 6 (six) hours as needed for pain.    Historical Provider, MD  hydrochlorothiazide (HYDRODIURIL)  25 MG tablet Take 1 tablet (25 mg total) by mouth daily. 09/04/13   Carlyle Dollyhristopher W Lawyer, PA-C  HYDROcodone-acetaminophen (NORCO/VICODIN) 5-325 MG per tablet Take 1 tablet by mouth every 6 (six) hours as needed for moderate pain. 09/04/13   Jamesetta Orleanshristopher W Lawyer, PA-C  predniSONE (DELTASONE) 50 MG tablet Take 1 tablet (50 mg total) by mouth daily. 09/04/13   Jamesetta Orleanshristopher W Lawyer, PA-C   BP 148/117  Pulse 119  Temp(Src) 98.2 F (36.8 C) (Oral)  Resp 16  Ht 5\' 7"  (1.702 m)  Wt 118 lb 8 oz (53.751 kg)  BMI 18.56 kg/m2  SpO2 98% Physical Exam  Nursing note and vitals reviewed. Constitutional: He is oriented to person, place, and time. He appears well-developed and well-nourished.  HENT:  Head: Normocephalic and atraumatic.  Right Ear: External ear normal.  Mouth/Throat: Oropharynx is clear and moist.  Eyes: Conjunctivae and EOM are normal. Pupils are equal, round, and reactive to light.  Neck: Normal range of motion.  Cardiovascular: Normal rate and normal heart sounds.   Pulmonary/Chest: Effort normal and breath sounds normal.  Abdominal: He exhibits distension.  Musculoskeletal: Normal range of motion. He exhibits tenderness.  Neurological: He is alert and oriented to person, place, and time.  Skin: Skin is warm.  Psychiatric: He has a normal mood and affect.    ED Course  Procedures (including critical care time) Labs Review Labs Reviewed - No data to display  Imaging Review No results found.   EKG Interpretation None  MDM   Final diagnoses:  Asthma  Shoulder pain, right    Pt given albuterol inhaler here and rx for albuterol.   Rx for hydrocodone.      Lonia SkinnerLeslie K VenturaSofia, PA-C 09/20/13 1222

## 2013-09-20 NOTE — ED Provider Notes (Signed)
Medical screening examination/treatment/procedure(s) were performed by non-physician practitioner and as supervising physician I was immediately available for consultation/collaboration.   EKG Interpretation None       Jaimey Franchini T Frederick Klinger, MD 09/20/13 1522 

## 2013-09-20 NOTE — ED Notes (Signed)
Pt presents to ED due to running out of his albuterol inhaler 3 weeks ago, pt reports some wheezing x2 weeks, pt also c/o Right arm pain seen here last month ago for the same. Pt states increase pain with movement

## 2013-09-20 NOTE — ED Notes (Addendum)
Pt discharged home with all belongings, pat alert, oriented and ambulatory upon discharge, 2 new RX prescribed, pt verbalizes understanding of discharge instructions. EDPA aware of BP

## 2013-10-24 ENCOUNTER — Emergency Department (HOSPITAL_COMMUNITY)
Admission: EM | Admit: 2013-10-24 | Discharge: 2013-10-24 | Disposition: A | Discharge status: Home / Self Care | Payer: Medicaid Other | Attending: Emergency Medicine | Admitting: Emergency Medicine

## 2013-10-24 ENCOUNTER — Encounter (HOSPITAL_COMMUNITY): Payer: Self-pay | Admitting: Emergency Medicine

## 2013-10-24 DIAGNOSIS — K089 Disorder of teeth and supporting structures, unspecified: Secondary | ICD-10-CM | POA: Insufficient documentation

## 2013-10-24 DIAGNOSIS — G8929 Other chronic pain: Secondary | ICD-10-CM | POA: Insufficient documentation

## 2013-10-24 DIAGNOSIS — K1370 Unspecified lesions of oral mucosa: Secondary | ICD-10-CM | POA: Insufficient documentation

## 2013-10-24 DIAGNOSIS — M5410 Radiculopathy, site unspecified: Secondary | ICD-10-CM

## 2013-10-24 DIAGNOSIS — IMO0002 Reserved for concepts with insufficient information to code with codable children: Secondary | ICD-10-CM | POA: Insufficient documentation

## 2013-10-24 DIAGNOSIS — Z8639 Personal history of other endocrine, nutritional and metabolic disease: Secondary | ICD-10-CM | POA: Insufficient documentation

## 2013-10-24 DIAGNOSIS — Z79899 Other long term (current) drug therapy: Secondary | ICD-10-CM | POA: Insufficient documentation

## 2013-10-24 DIAGNOSIS — J45909 Unspecified asthma, uncomplicated: Secondary | ICD-10-CM | POA: Insufficient documentation

## 2013-10-24 DIAGNOSIS — K136 Irritative hyperplasia of oral mucosa: Secondary | ICD-10-CM

## 2013-10-24 DIAGNOSIS — Z862 Personal history of diseases of the blood and blood-forming organs and certain disorders involving the immune mechanism: Secondary | ICD-10-CM | POA: Insufficient documentation

## 2013-10-24 DIAGNOSIS — I10 Essential (primary) hypertension: Secondary | ICD-10-CM | POA: Insufficient documentation

## 2013-10-24 MED ORDER — OXYCODONE-ACETAMINOPHEN 5-325 MG PO TABS
2.0000 | ORAL_TABLET | Freq: Once | ORAL | Status: AC
  Administered 2013-10-24: 2 via ORAL
  Filled 2013-10-24: qty 2

## 2013-10-24 MED ORDER — MAGIC MOUTHWASH W/LIDOCAINE: 5.0000 mL | Freq: Three times a day (TID) | ORAL | Status: DC | PRN

## 2013-10-24 MED ORDER — OXYCODONE-ACETAMINOPHEN 5-325 MG PO TABS: 2.0000 | ORAL_TABLET | ORAL | Status: DC | PRN

## 2013-10-24 MED ORDER — CHLORHEXIDINE GLUCONATE 0.12 % MT SOLN: 15.0000 mL | Freq: Two times a day (BID) | OROMUCOSAL | Status: DC

## 2013-10-24 NOTE — Discharge Instructions (Signed)
Take percocet for severe pain - Please be careful with this medication.  It can cause drowsiness.  Use caution while driving, operating machinery, drinking alcohol, or any other activities that may impair your physical or mental abilities.   Use magic mouthwash to help your oral pain - chew soft foods Use Peridex - an antibiotic rinse - continue good oral hygiene  Follow-up with a dentist, pain management specialist, and primary doctor  Continue to take your blood pressure medications  Return to the emergency department if you develop any changing/worsening condition, loss of bowel/bladder function, fever, difficulty swallowing/breathing, weakness, or any other concerns (please read additional information regarding your condition below)     Back Pain, Adult Low back pain is very common. About 1 in 5 people have back pain.The cause of low back pain is rarely dangerous. The pain often gets better over time.About half of people with a sudden onset of back pain feel better in just 2 weeks. About 8 in 10 people feel better by 6 weeks.  CAUSES Some common causes of back pain include:  Strain of the muscles or ligaments supporting the spine.  Wear and tear (degeneration) of the spinal discs.  Arthritis.  Direct injury to the back. DIAGNOSIS Most of the time, the direct cause of low back pain is not known.However, back pain can be treated effectively even when the exact cause of the pain is unknown.Answering your caregiver's questions about your overall health and symptoms is one of the most accurate ways to make sure the cause of your pain is not dangerous. If your caregiver needs more information, he or she may order lab work or imaging tests (X-rays or MRIs).However, even if imaging tests show changes in your back, this usually does not require surgery. HOME CARE INSTRUCTIONS For many people, back pain returns.Since low back pain is rarely dangerous, it is often a condition that people can  learn to [facility] their own.   Remain active. It is stressful on the back to sit or stand in one place. Do not sit, drive, or stand in one place for more than 30 minutes at a time. Take short walks on level surfaces as soon as pain allows.Try to increase the length of time you walk each day.  Do not stay in bed.Resting more than 1 or 2 days can delay your recovery.  Do not avoid exercise or work.Your body is made to move.It is not dangerous to be active, even though your back may hurt.Your back will likely heal faster if you return to being active before your pain is gone.  Pay attention to your body when you bend and lift. Many people have less discomfortwhen lifting if they bend their knees, keep the load close to their bodies,and avoid twisting. Often, the most comfortable positions are those that put less stress on your recovering back.  Find a comfortable position to sleep. Use a firm mattress and lie on your side with your knees slightly bent. If you lie on your back, put a pillow under your knees.  Only take over-the-counter or prescription medicines as directed by your caregiver. Over-the-counter medicines to reduce pain and inflammation are often the most helpful.Your caregiver may prescribe muscle relaxant drugs.These medicines help dull your pain so you can more quickly return to your normal activities and healthy exercise.  Put ice on the injured area.  Put ice in a plastic bag.  Place a towel between your skin and the bag.  Leave the ice on  for 15-20 minutes, 03-04 times a day for the first 2 to 3 days. After that, ice and heat may be alternated to reduce pain and spasms.  Ask your caregiver about trying back exercises and gentle massage. This may be of some benefit.  Avoid feeling anxious or stressed.Stress increases muscle tension and can worsen back pain.It is important to recognize when you are anxious or stressed and learn ways to manage it.Exercise is a great  option. SEEK MEDICAL CARE IF:  You have pain that is not relieved with rest or medicine.  You have pain that does not improve in 1 week.  You have new symptoms.  You are generally not feeling well. SEEK IMMEDIATE MEDICAL CARE IF:   You have pain that radiates from your back into your legs.  You develop new bowel or bladder control problems.  You have unusual weakness or numbness in your arms or legs.  You develop nausea or vomiting.  You develop abdominal pain.  You feel faint. Document Released: 05/14/2005 Document Revised: 11/13/2011 Document Reviewed: 10/02/2010 [facility] Patient Information 2014 [place].  Sciatica Sciatica is pain, weakness, numbness, or tingling along the path of the sciatic nerve. The nerve starts in the lower back and runs down the back of each leg. The nerve controls the muscles in the lower leg and in the back of the knee, while also providing sensation to the back of the thigh, lower leg, and the sole of your foot. Sciatica is a symptom of another medical condition. For instance, nerve damage or certain conditions, such as a herniated disk or bone spur on the spine, pinch or put pressure on the sciatic nerve. This causes the pain, weakness, or other sensations normally associated with sciatica. Generally, sciatica only affects one side of the body. CAUSES   Herniated or slipped disc.  Degenerative disk disease.  A pain disorder involving the narrow muscle in the buttocks (piriformis syndrome).  Pelvic injury or fracture.  Pregnancy.  Tumor (rare). SYMPTOMS  Symptoms can vary from mild to very severe. The symptoms usually travel from the low back to the buttocks and down the back of the leg. Symptoms can include:  Mild tingling or dull aches in the lower back, leg, or hip.  Numbness in the back of the calf or sole of the foot.  Burning sensations in the lower back, leg, or hip.  Sharp pains in the lower back, leg, or hip.  Leg  weakness.  Severe back pain inhibiting movement. These symptoms may get worse with coughing, sneezing, laughing, or prolonged sitting or standing. Also, being overweight may worsen symptoms. DIAGNOSIS  Your caregiver will perform a physical exam to look for common symptoms of sciatica. He or she may ask you to do certain movements or activities that would trigger sciatic nerve pain. Other tests may be performed to find the cause of the sciatica. These may include:  Blood tests.  X-rays.  Imaging tests, such as an MRI or CT scan. TREATMENT  Treatment is directed at the cause of the sciatic pain. Sometimes, treatment is not necessary and the pain and discomfort goes away on its own. If treatment is needed, your caregiver may suggest:  Over-the-counter medicines to relieve pain.  Prescription medicines, such as anti-inflammatory medicine, muscle relaxants, or narcotics.  Applying heat or ice to the painful area.  Steroid injections to lessen pain, irritation, and inflammation around the nerve.  Reducing activity during periods of pain.  Exercising and stretching to strengthen your abdomen  and improve flexibility of your spine. Your caregiver may suggest losing weight if the extra weight makes the back pain worse.  Physical therapy.  Surgery to eliminate what is pressing or pinching the nerve, such as a bone spur or part of a herniated disk. HOME CARE INSTRUCTIONS   Only take over-the-counter or prescription medicines for pain or discomfort as directed by your caregiver.  Apply ice to the affected area for 20 minutes, 3 4 times a day for the first 48 72 hours. Then try heat in the same way.  Exercise, stretch, or perform your usual activities if these do not aggravate your pain.  Attend physical therapy sessions as directed by your caregiver.  Keep all follow-up appointments as directed by your caregiver.  Do not wear high heels or shoes that do not provide proper  support.  Check your mattress to see if it is too soft. A firm mattress may lessen your pain and discomfort. SEEK IMMEDIATE MEDICAL CARE IF:   You lose control of your bowel or bladder (incontinence).  You have increasing weakness in the lower back, pelvis, buttocks, or legs.  You have redness or swelling of your back.  You have a burning sensation when you urinate.  You have pain that gets worse when you lie down or awakens you at night.  Your pain is worse than you have experienced in the past.  Your pain is lasting longer than 4 weeks.  You are suddenly losing weight without reason. MAKE SURE YOU:  Understand these instructions.  Will watch your condition.  Will get help right away if you are not doing well or get worse. Document Released: 05/08/2001 Document Revised: 11/13/2011 Document Reviewed: 09/23/2011 [facility] Patient Information 2014 [place], [place].  Gingivitis Gingivitis is a form of gum (periodontal) disease that causes redness, soreness, and swelling (inflammation) of your gums. CAUSES The most common cause of gingivitis is poor oral hygiene. A sticky substance made of bacteria, mucus, and food particles (plaque), is deposited on the exposed part of teeth. As plaque builds up, it reacts with the saliva in your mouth to form something called  tartar. Tartar is a hard deposit that becomes trapped around the base of the tooth. Plaque and tartar irritate the gums, leading to the formation of gingivitis. Other factors that increase your risk for gingivitis include:   Tobacco use.  Diabetes.  Older age.  Certain medications.  Certain viral or fungal infections.  Dry mouth.  Hormonal changes such as during pregnancy.  Poor nutrition.  Substance abuse.  Poor fitting dental restorations or appliances. SYMPTOMS You may notice inflammation of the soft tissue (gingiva) around the teeth. When these tissues become inflamed, they bleed easily, especially during  flossing or brushing. The gums may also be:   Tender to the touch.  Bright red, purple red, or have a shiny appearance.  Swollen.  Wearing away from the teeth (receding), which exposes more of the tooth. Bad breath is often present. Continued infection around teeth can eventually cause cavities and loosen teeth. This may lead to eventual tooth loss. DIAGNOSIS A medical and dental history will be taken. Your mouth, teeth, and gums will be examined. Your dentist will look for soft, swollen purple-red, irritated gums. There may be deposits of plaque and tartar at the base of the teeth. Your gums will be looked at for the degree of redness, puffiness, and bleeding tendencies. Your dentist will see if any of the teeth are loose. X-rays may be taken to  see if the inflammation has spread to the supporting structures of the teeth. TREATMENT The goal is to reduce and reverse the inflammation. Proper treatment can usually reverse the symptoms of gingivitis and prevent further progression of the disease. Have your teeth cleaned. During the cleaning, all plaque and tartar will be removed. Instruction for proper home care will be given. You will need regular professional cleanings and check-ups in the future. HOME CARE INSTRUCTIONS  Brush your teeth twice a day and floss at least once per day. When flossing, it is best to floss first then brush.  Limit sugar between meals and maintain a well-balanced diet.  Even the best dental hygiene will not prevent plaque from developing. It is necessary for you to see your dentist on a regular basis for cleaning and regular checkups.  Your dentist can recommend proper oral hygiene and mouth care and suggest special toothpastes or mouth rinses.  Stop smoking. SEEK DENTAL OR MEDICAL CARE IF:  You have painful, reddened tissue around your teeth, or you have puffy swollen gums.  You have difficulty chewing.  You notice any loose or infected teeth.  You have  swollen glands.  Your gums bleed easily when you brush your teeth or are very tender to the touch. Document Released: 11/07/2000 Document Revised: 08/06/2011 Document Reviewed: 08/18/2010 [facility] Patient Information 2014 [place], [place].  Hypertension As your heart beats, it forces blood through your arteries. This force is your blood pressure. If the pressure is too high, it is called hypertension (HTN) or high blood pressure. HTN is dangerous because you may have it and not know it. High blood pressure may mean that your heart has to work harder to pump blood. Your arteries may be narrow or stiff. The extra work puts you at risk for heart disease, stroke, and other problems.  Blood pressure consists of two numbers, a higher number over a lower, 110/72, for example. It is stated as "110 over 72." The ideal is below 120 for the top number (systolic) and under 80 for the bottom (diastolic). Write down your blood pressure today. You should pay close attention to your blood pressure if you have certain conditions such as: Heart failure. Prior heart attack. Diabetes Chronic kidney disease. Prior stroke. Multiple risk factors for heart disease. To see if you have HTN, your blood pressure should be measured while you are seated with your arm held at the level of the heart. It should be measured at least twice. A one-time elevated blood pressure reading (especially in the Emergency Department) does not mean that you need treatment. There may be conditions in which the blood pressure is different between your right and left arms. It is important to see your caregiver soon for a recheck. Most people have essential hypertension which means that there is not a specific cause. This type of high blood pressure may be lowered by changing lifestyle factors such as: Stress. Smoking. Lack of exercise. Excessive weight. Drug/tobacco/alcohol use. Eating less salt. Most people do not have symptoms from high  blood pressure until it has caused damage to the body. Effective treatment can often prevent, delay or reduce that damage. TREATMENT  When a cause has been identified, treatment for high blood pressure is directed at the cause. There are a large number of medications to treat HTN. These fall into several categories, and your caregiver will help you select the medicines that are best for you. Medications may have side effects. You should review side effects with your  caregiver. If your blood pressure stays high after you have made lifestyle changes or started on medicines,  Your medication(s) may need to be changed. Other problems may need to be addressed. Be certain you understand your prescriptions, and know how and when to take your medicine. Be sure to follow up with your caregiver within the time frame advised (usually within two weeks) to have your blood pressure rechecked and to review your medications. If you are taking more than one medicine to lower your blood pressure, make sure you know how and at what times they should be taken. Taking two medicines at the same time can result in blood pressure that is too low. SEEK IMMEDIATE MEDICAL CARE IF: You develop a severe headache, blurred or changing vision, or confusion. You have unusual weakness or numbness, or a faint feeling. You have severe chest or abdominal pain, vomiting, or breathing problems. MAKE SURE YOU:  Understand these instructions. Will watch your condition. Will get help right away if you are not doing well or get worse. Document Released: 05/14/2005 Document Revised: 08/06/2011 Document Reviewed: 01/02/2008 [facility] Patient Information 2014 [place], [place].  Nonspecific Tachycardia Tachycardia is a faster than normal heartbeat (more than 100 beats per minute). In adults, the heart normally beats between 60 and 100 times a minute. A fast heartbeat may be a normal response to exercise or stress. It does not necessarily mean  that something is wrong. However, sometimes when your heart beats too fast it may not be able to pump enough blood to the rest of your body. This can result in chest pain, shortness of breath, dizziness, and even fainting. Nonspecific tachycardia means that the specific cause or pattern of your tachycardia is unknown. CAUSES  Tachycardia may be harmless or it may be due to a more serious underlying cause. Possible causes of tachycardia include:  Exercise or exertion.  Fever.  Pain or injury.  Infection.  Loss of body fluids (dehydration).  Overactive thyroid.  Lack of red blood cells (anemia).  Anxiety and stress.  Alcohol.  Caffeine.  Tobacco products.  Diet pills.  Illegal drugs.  Heart disease. SYMPTOMS  Rapid or irregular heartbeat (palpitations).  Suddenly feeling your heart beating (cardiac awareness).  Dizziness.  Tiredness (fatigue).  Shortness of breath.  Chest pain.  Nausea.  Fainting. DIAGNOSIS  Your caregiver will perform a physical exam and take your medical history. In some cases, a heart specialist (cardiologist) may be consulted. Your caregiver may also order:  Blood tests.  Electrocardiography. This test records the electrical activity of your heart.  A heart monitoring test. TREATMENT  Treatment will depend on the likely cause of your tachycardia. The goal is to treat the underlying cause of your tachycardia. Treatment methods may include:  Replacement of fluids or blood through an intravenous (IV) tube for moderate to severe dehydration or anemia.  New medicines or changes in your current medicines.  Diet and lifestyle changes.  Treatment for certain infections.  Stress relief or relaxation methods. HOME CARE INSTRUCTIONS   Rest.  Drink enough fluids to keep your urine clear or pale yellow.  Do not smoke.  Avoid:  Caffeine.  Tobacco.  Alcohol.  Chocolate.  Stimulants such as over-the-counter diet pills or pills  that help you stay awake.  Situations that cause anxiety or stress.  Illegal drugs such as marijuana, phencyclidine (PCP), and cocaine.  Only take medicine as directed by your caregiver.  Keep all follow-up appointments as directed by your caregiver. SEEK IMMEDIATE MEDICAL  CARE IF:   You have pain in your chest, upper arms, jaw, or neck.  You become weak, dizzy, or feel faint.  You have palpitations that will not go away.  You vomit, have diarrhea, or pass blood in your stool.  Your skin is cool, pale, and wet.  You have a fever that will not go away with rest, fluids, and medicine. MAKE SURE YOU:   Understand these instructions.  Will watch your condition.  Will get help right away if you are not doing well or get worse. Document Released: 06/21/2004 Document Revised: 08/06/2011 Document Reviewed: 04/24/2011 [facility] Patient Information 2014 [place], [place].   Emergency Department Resource Guide 1) Find a Doctor and Pay Out of Pocket Although you won't have to find out who is covered by your insurance plan, it is a good idea to ask around and get recommendations. You will then need to call the office and see if the doctor you have chosen will accept you as a new patient and what types of options they offer for patients who are self-pay. Some doctors offer discounts or will set up payment plans for their patients who do not have insurance, but you will need to ask so you aren't surprised when you get to your appointment.  2) Contact Your Local Health Department Not all health departments have doctors that can see patients for sick visits, but many do, so it is worth a call to see if yours does. If you don't know where your local health department is, you can check in your phone book. The CDC also has a tool to help you locate your state's health department, and many state websites also have listings of all of their local health departments.  3) Find a Walk-in Clinic If your  illness is not likely to be very severe or complicated, you may want to try a walk in clinic. These are popping up all over the country in pharmacies, drugstores, and shopping centers. They're usually staffed by nurse practitioners or physician assistants that have been trained to treat common illnesses and complaints. They're usually fairly quick and inexpensive. However, if you have serious medical issues or chronic medical problems, these are probably not your best option.  No Primary Care Doctor: - Call [facility] Connect at  [phone] - they can help you locate a primary care doctor that  accepts your insurance, provides certain services, etc. - Physician Referral Service- [phone]  Chronic Pain Problems: Organization         Address  Phone   Notes  [facility]  [phone] Patients need to be referred by their primary care doctor.   Medication Assistance: Organization         Address  Phone   Notes  [facility] Medication [facility] [address] [place] [place], [place] [address] [phone] --Must be a resident of [facility] -- Must have NO insurance coverage whatsoever (no Medicaid/ Medicare, etc.) -- The pt. MUST have a primary care doctor that directs their care regularly and follows them in the community   [facility]  [phone]   [employer]  [phone]    Agencies that provide inexpensive medical care: Organization         Address  Phone   Notes  [facility]  [phone]   [facility]    [phone]   [facility] [address] [place], [place] [address] [phone]  [phone]   [facility] [address] [place] [phone]   Planned Parenthood    [phone]   [facility]    [phone]   [facility]  [address], [place] Phone:  [phone], Fax:  [phone] Hours of Operation:   9 am - 6 pm, M-F.  Also accepts Medicaid/Medicare and self-pay.  [facility]  [address], [place] Phone: [phone], Fax: [phone]. Hours of Operation:  8:30 am - 5:30 pm, M-F.  Also accepts Medicaid and self-pay.  [facility] [place] [address], [place] Phone: [phone]   [facility] [address] Natasha Bence [place], [place] [phone], Ext. [phone] Mondays & Thursdays: 7-9 AM.  First 15 patients are seen on a first come, first serve basis.    Medicaid-accepting [facility] Providers:  Organization         Address  Phone   Notes  [facility] [address], [place] [phone] Also accepts self-pay patients.  [facility] [address] Laurell Josephs [place], [place]  [phone]   [facility] [address], [place] [phone]   [facility] [address] [place] [phone]   Renaye Rakers [address], [place]   [phone] Only accepts [place] patients after they have their name applied to their card.   Self-Pay (no insurance) in [facility]:  Organization         Address  Phone   Notes  Sickle Cell Patients, [facility] [address] [place], [place] [phone]   [facility] [address] [place], [place] [phone]   [facility] [place]  [address], [place] [phone]   [facility]/Dr. Osei-Bonsu  [address] [place] or [address], [place] [phone] Phone number for both [place] and [place] locations is the same.  [facility] and [facility] [address], [place] [phone]   [facility] [address] [place] or [address] [phone]   [facility] [address], [place] [phone], phone; [phone], fax Sees patients 1st and 3rd Saturday of every month.  Must not qualify for public or private insurance (i.e. Medicaid, Medicare, [place] [employer] Choice, Veterans' Benefits)  Household income should be no more than 200% of the poverty level The clinic cannot treat you if you are pregnant or think you are pregnant  Sexually transmitted diseases are not treated at the clinic.    Dental Care: Organization         Address  Phone  Notes  [facility] Department of [facility] [facility] [address] [place], [place] [phone] Accepts children up to age 73 who are enrolled in [place] or [place] Health Choice; pregnant women with a Medicaid card; and children who have applied for Medicaid or [place] Health Choice, but were declined, whose parents can pay a reduced fee at time of service.  [facility] Department of [facility]  [address], [place] [phone] Accepts children up to age 20 who are enrolled in [place] or [place] Health Choice; pregnant women with a  Medicaid card; and children who have applied for Medicaid or [place] Health Choice, but were declined, whose parents can pay a reduced fee at time of service.  [facility]  [address] [place], [place] [phone] Patients are seen by appointment only. Walk-ins are not accepted. [facility] will see patients 88 years of age and older. Monday - Tuesday (8am-5pm) Most Wednesdays (8:30-5pm) $30 per visit, cash only  [facility]  [address], [facility] [phone] Patients are seen by appointment only. Walk-ins are not accepted. [facility] will see patients 69 years of age and older. One Wednesday Evening (Monthly: Volunteer Based).  $30 per visit, cash only  [employer]  [phone] for adults; Children under age 19, call Graduate Pediatric Dentistry at  [phone]. Children aged 72-14, please call [phone] to request a pediatric application.  Dental services are provided in all areas of dental care including fillings, crowns and bridges, complete and partial dentures, implants, gum treatment, root canals, and extractions. Preventive care is also provided. Treatment is provided to both adults and children. Patients are selected via a lottery and there is often a waiting list.   [facility] [address] [place]  [phone] [email or web address]   [facility] [address] [place], [place] [phone], Ext. [phone] Second and Fourth Thursday of each month, opens at 6:30 AM; Clinic ends at 9 AM.  Patients are seen on a first-come first-served basis, and a limited number are seen during each clinic.   [facility]  [address] [place], [place] [phone]   Eligibility Requirements You must have lived in [place], [place], or [place] counties for at least the last three months.   You cannot be eligible for state or federal sponsored [place], including [employer], [place], or [employer].   You generally cannot be eligible for healthcare insurance through your employer.    How to apply: Eligibility screenings are held every Tuesday and Wednesday afternoon from 1:00 pm until 4:00 pm. You do not need an appointment for the interview!  [facility] [address] [place], [place] [phone]   [facility] Health Department  [phone]   [facility] Health Department  [phone]   [facility] Health Department  [phone]    Behavioral Health Resources in the Community: Intensive Outpatient Programs Organization         Address  Phone  Notes  [facility] [address], [place], [place] [phone]   [facility] [address] [place], [place] [phone]   [facility]: [facility] &  Drug Sv[facility] [address], [place], [place]  [phone]   [facility] [address]  [place], [place] [phone] or [phone]   Substance Abuse Resources Organization         Address  Phone  Notes  Al[facility] Drug Services  [phone]   [facility]  [phone]   [facility]  [phone]   Floydene Flock  [phone]   Residential & Outpatient Substance Abuse Program  [phone]   Psychological Services Organization         Address  Phone  Notes  [facility] Behavioral Health  [phone]   [facility] Services  [phone]   [facility] Mental Health [address], [place] [phone] or [phone]    Mobile Crisis Teams Organization  Address  Phone  Notes  [facility], Mobile Crisis Care Unit  [phone]   [facility]  [address] [place], [place] [phone]   [facility] [address] [place] [phone]    Self-Help/Support Groups Organization         Address  Phone             Notes  [facility] [place] - variety of support groups  [phone]- [phone] Call for more information  Narcotics Anonymous (NA), [facility] [address], [employer] [place]  2 meetings at this location   Statistician         Address  Phone  Notes  [facility] [address],    [place]  [phone]   [facility]  [address] [place] [address], [place], [place] [phone]   [facility] [address] [place], [place] [phone] Admissions: 8am-3pm M-F  [facility] [address],    [place], [place] [phone]   [facility] [address] [place], [place], [place] [phone]   [facility] [address]  [place], [place] [phone]   [facility] - Intensive Outpatient [address], [place], [place]  [phone]   [facility] ([facility] [address] [place]  [place], [place] [phone] or [phone]   [facility] ([facility]) [address] [place], [place] [phone] Accepts Medicaid  [facility] [address]  [place] [phone] Substance Abuse/Addiction Treatment   [facility] Organization         Address  Phone  Notes  [facility]  [phone]   Angie Fava, PhD [address] [place], [place]   [phone] or [phone]   [facility] Behavioral   [address] [place], [place] [phone]   [facility] [address], [place], [place] [phone] Insurance/Medicaid/sponsorship through [facility] and Families [address]                                    [place], [place] [phone] Therapy/tele-psych/case  [facility] [address], [place] [phone]    Dr. Lolly Mustache  [phone]   [facility]  [employer] [facility] Dept. 1) [address] [place] 2) [address] [place] 3)  [address], [place] [phone]  [phone]   [facility] Child Abuse Hotline 804-089-1524 or [phone] (After Hours)

## 2013-10-24 NOTE — ED Notes (Signed)
Per pt sts gum pain due to not having false teeth and having to chew food with gums. sts also his whole left sdie hurts from the neck down mostly his leg. sts the pain is chronic but has gotten worse.

## 2013-10-24 NOTE — ED Provider Notes (Signed)
CSN: [id number]     Arrival date & time 10/24/13  1231 History  This chart was scribed for non-physician practitioner, Alver Fisher, PA-C,working with Layla Maw Ward, DO, by Karle Plumber, ED Scribe.  This patient was seen in room TR06C/TR06C and the patient's care was started at 12:59 PM.  Chief Complaint  Patient presents with  . Oral Pain  . Leg Pain   The history is provided by the patient. No language interpreter was used.   HPI Comments:  Jordan Schaefer is a 38 y.o. male with a PMH of chronic pain, back pain, HTN and asthma who presents to the Emergency Department complaining of gingival and leg pain.   Patient complains of gingival pain and irritation which has been gradually worsening over the past two months. Patient previously had dentures but lost them and has been chewing his food with his gums. He states he previously had a mouth wash given to him by the dentist (Peridex?), but he has ran out of this. He states he was also prescribed a "paste" for his gums similar to Orajel but is unsure what it was. States he is having difficulty eating now due to gum pain. He denies any fever, oral sores/bleeding, difficulty swallowing, trismus, neck pain, headache, vomiting, nausea or sore throat.   He also complains of chronic unchanged left leg pain. Has had bilateral leg pain in the past, but the left is worse than the right. Pain starts on his left lower back and radiates down the back of his left leg and occasionally up the left side of his back. Patient reports intermittent numbness and tingling. Patient has been diagnosed with sciatica in the past and was advised to have surgery to correct the issue but pt does not want to have the operation. He reports using Bengay on his leg with no relief. Pt reports he has taken Motrin and Naproxen, both makes him nauseous and increase his BP. He states he has tramadol at home but it does not help. He states he has had Flexeril in the past with  no relief. He denies leg swelling, bowel or bladder incontinence, weakness, dysuria, abdominal pain, SOB or CP. He denies any recent fall, injury or trauma. Pt is ambulatory without issue. He states he normally walks with a cain but does not have it with him. He states he has an appt on July 8 with "a free clinic" to establish primary care but states it was changed and is awaiting them to call him back with a new appt.     Past Medical History  Diagnosis Date  . Back pain   . Hypertension   . Hypercholesteremia   . Migraine   . Asthma    History reviewed. No pertinent past surgical history. History reviewed. No pertinent family history. History  Substance Use Topics  . Smoking status: Never Smoker   . Smokeless tobacco: Not on file  . Alcohol Use: Yes     Comment: wine every so often    Review of Systems  Constitutional: Negative for fever, chills, activity change, appetite change and fatigue.  HENT: Positive for dental problem (gingival). Negative for congestion, ear pain, mouth sores, rhinorrhea, sore throat and trouble swallowing.   Respiratory: Negative for shortness of breath.   Cardiovascular: Negative for chest pain and leg swelling.  Gastrointestinal: Negative for nausea, vomiting and abdominal pain.  Genitourinary: Negative for dysuria and difficulty urinating.  Musculoskeletal: Positive for back pain. Negative for gait problem, joint swelling,  myalgias, neck pain and neck stiffness.  Skin: Negative for color change and wound.  Neurological: Positive for numbness (intermittent). Negative for weakness and headaches.  All other systems reviewed and are negative.   Allergies  Flexeril; Motrin; and Naproxen  Home Medications   Prior to Admission medications   Medication Sig Start Date End Date Taking? Authorizing Provider  albuterol (PROVENTIL HFA;VENTOLIN HFA) 108 (90 BASE) MCG/ACT inhaler Inhale 1-2 puffs into the lungs every 6 (six) hours as needed for wheezing or  shortness of breath. 09/20/13   Elson AreasLeslie K Sofia, PA-C  hydrochlorothiazide (HYDRODIURIL) 25 MG tablet Take 1 tablet (25 mg total) by mouth daily. 09/04/13   Carlyle Dollyhristopher W Lawyer, PA-C  HYDROcodone-acetaminophen (NORCO/VICODIN) 5-325 MG per tablet Take 1 tablet by mouth every 6 (six) hours as needed for moderate pain. 09/04/13   Carlyle Dollyhristopher W Lawyer, PA-C  HYDROcodone-acetaminophen (NORCO/VICODIN) 5-325 MG per tablet Take 2 tablets by mouth every 4 (four) hours as needed for moderate pain. 09/20/13   Elson AreasLeslie K Sofia, PA-C  OVER THE COUNTER MEDICATION Take 1 tablet by mouth at bedtime as needed (sleep). Over the counter sleep aid tablet    Historical Provider, MD  predniSONE (DELTASONE) 50 MG tablet Take 1 tablet (50 mg total) by mouth daily. 09/04/13   Carlyle Dollyhristopher W Lawyer, PA-C   Triage Vitals: BP 138/91  Pulse 115  Temp(Src) 98.4 F (36.9 C) (Oral)  Resp 18  SpO2 98%  Filed Vitals:   10/24/13 1235 10/24/13 1333  BP: 138/91 144/100  Pulse: 115 83  Temp: 98.4 F (36.9 C) 97.6 F (36.4 C)  TempSrc: Oral Oral  Resp: 18 20  SpO2: 98% 100%    Physical Exam  Nursing note and vitals reviewed. Constitutional: He is oriented to person, place, and time. He appears well-developed and well-nourished. No distress.  Non-toxic  HENT:  Head: Normocephalic and atraumatic.  Right Ear: External ear normal.  Left Ear: External ear normal.  Nose: Nose normal.  Mouth/Throat: Oropharynx is clear and moist. No oropharyngeal exudate.  Edentulous. Irritation to the gingival tissue throughout. No plaque, oral sores, or lesions. No erythema to the posterior pharynx. Tonsils without edema or exudates. Uvula midline. No trismus. No difficulty controlling secretions. Tympanic membranes gray and translucent bilaterally with no erythema, edema, or hemotympanum.  No mastoid or tragal tenderness bilaterally.   Eyes: Conjunctivae and EOM are normal. Pupils are equal, round, and reactive to light. Right eye exhibits no  discharge. Left eye exhibits no discharge.  Neck: Normal range of motion.  No cervical lymphadenopathy. No nuchal rigidity.   Cardiovascular: Normal rate, regular rhythm and normal heart sounds.  Exam reveals no gallop and no friction rub.   No murmur heard. Dorsalis pedis pulses present and equal bilaterally  Pulmonary/Chest: Breath sounds normal. No respiratory distress. He has no wheezes. He has no rales. He exhibits no tenderness.  Abdominal: Soft. He exhibits no distension. There is no tenderness.  Musculoskeletal: Normal range of motion. He exhibits tenderness. He exhibits no edema.       Arms: Tenderness to palpation to the left lower paraspinal region. Positive straight leg raise on the left. No tenderness to palpation to the LE throughout. Strength 5/5 in the upper and lower extremities bilaterally. Patient able to ambulate without difficulty or ataxia. No LE edema or calf tenderness bilaterally   Neurological: He is alert and oriented to person, place, and time.  Patellar reflexes intact bilaterally. Gross sensation intact in the LE bilaterally   Skin: Skin is  warm and dry. He is not diaphoretic.  Psychiatric: He has a normal mood and affect. His behavior is normal.    ED Course  Procedures (including critical care time) DIAGNOSTIC STUDIES: Oxygen Saturation is 98% on RA, normal by my interpretation.   COORDINATION OF CARE: 1:13 PM- Will refer to pain management, [employer] [facility], and dentist. Will prescribe pain medication. Pt verbalizes understanding and agrees to plan.  Medications - No data to display  Labs Review Labs Reviewed - No data to display  Imaging Review No results found.   EKG Interpretation None      MDM   Jordan Schaefer is a 38 y.o. male is a 38 y.o. male with a PMH of chronic pain, back pain, HTN and asthma who presents to the Emergency Department complaining of gingival and leg pain. Patient likely has gingivitis vs gum irritation from  chewing food without dentures. Patient prescribed magic mouthwash and Peridex. Given dental resources for follow-up. Etiology of leg pain likely due to lumbar back pain with left sided radiculopathy/sciatica. Patient states this is chronic pain with no acute changes. No warning signs or symptoms of back pain including loss of bowel or bladder control, night sweats, waking from sleep with back pain, unexplained fevers or weight loss, history of cancer, or IV drug use. No concern for cauda equina, epidural abscess, or other serious/life threatening cause of back pain. Patient afebrile and non-toxic in appearance. Patient neurovascularly intact. Patient given resources for PCP and pain management clinic. Given short course of pain medication (6 tablets). BP mildly elevated during ED visit in the 130-140's. Patient instructed to continue his BP medications and follow-up with PCP. Return precautions, discharge instructions, and follow-up was discussed with the patient before discharge.     Discharge Medication List as of 10/24/2013  1:40 PM    START taking these medications   Details  Alum & Mag Hydroxide-Simeth (MAGIC MOUTHWASH W/LIDOCAINE) SOLN Take 5 mLs by mouth 3 (three) times daily as needed for mouth pain., Starting 10/24/2013, Until Discontinued, Print    chlorhexidine (PERIDEX) 0.12 % solution Use as directed 15 mLs in the mouth or throat 2 (two) times daily., Starting 10/24/2013, Until Discontinued, Print    oxyCODONE-acetaminophen (PERCOCET/ROXICET) 5-325 MG per tablet Take 2 tablets by mouth every 4 (four) hours as needed for severe pain., Starting 10/24/2013, Until Discontinued, Print        Final impressions: 1. Irritation of oral cavity   2. Back pain with left-sided radiculopathy   3. Hypertension       Luiz Iron PA-C    I personally performed the services described in this documentation, which was scribed in my presence. The recorded information has been reviewed and is  accurate.    Jillyn Ledger, PA-C 10/26/13 1103

## 2013-10-29 NOTE — ED Provider Notes (Signed)
Medical screening examination/treatment/procedure(s) were performed by non-physician practitioner and as supervising physician I was immediately available for consultation/collaboration.   EKG Interpretation None        Shannen Flansburg N Kalik Hoare, DO 10/29/13 0705 

## 2013-11-02 ENCOUNTER — Ambulatory Visit: Payer: Medicaid Other | Attending: Internal Medicine | Admitting: Internal Medicine

## 2013-11-02 ENCOUNTER — Encounter: Payer: Self-pay | Admitting: Internal Medicine

## 2013-11-02 VITALS — BP 147/92 | HR 102 | Temp 98.4°F | Resp 14 | Ht 68.0 in | Wt 119.2 lb

## 2013-11-02 DIAGNOSIS — M25559 Pain in unspecified hip: Secondary | ICD-10-CM | POA: Diagnosis not present

## 2013-11-02 DIAGNOSIS — Z79899 Other long term (current) drug therapy: Secondary | ICD-10-CM | POA: Insufficient documentation

## 2013-11-02 DIAGNOSIS — K5900 Constipation, unspecified: Secondary | ICD-10-CM

## 2013-11-02 DIAGNOSIS — I10 Essential (primary) hypertension: Secondary | ICD-10-CM | POA: Diagnosis not present

## 2013-11-02 DIAGNOSIS — J45909 Unspecified asthma, uncomplicated: Secondary | ICD-10-CM | POA: Diagnosis not present

## 2013-11-02 MED ORDER — OMEPRAZOLE 20 MG PO CPDR: 20.0000 mg | DELAYED_RELEASE_CAPSULE | Freq: Every day | ORAL | Status: DC

## 2013-11-02 MED ORDER — ACETAMINOPHEN-CODEINE #3 300-30 MG PO TABS: 1.0000 | ORAL_TABLET | Freq: Three times a day (TID) | ORAL | Status: DC | PRN

## 2013-11-02 MED ORDER — HYDROCHLOROTHIAZIDE 25 MG PO TABS: 25.0000 mg | ORAL_TABLET | Freq: Every day | ORAL | Status: DC

## 2013-11-02 MED ORDER — POLYETHYLENE GLYCOL 3350 17 GM/SCOOP PO POWD: 17.0000 g | Freq: Every day | ORAL | Status: DC

## 2013-11-02 NOTE — Progress Notes (Signed)
Patient ID: Jordan Schaefer, male   DOB: Feb 28, 1976, 38 y.o.   MRN: [id number]  FHQ:[id number]  QDI:[id number]  DOB - [birth year]  CC:  Chief Complaint  Patient presents with  . Establish Care  . Hypertension  . Hip Pain    Left       HPI: Jordan Schaefer is a 38 y.o. male here today to establish medical care.  Takes BP medication once every other day.  Has been on lisinopril, HCTZ, and Norvasc.  Patient reports that the medication makes him feel dizzy and weak.  Was in therapy at [facility] for his leg pain while living in [place].  Patient reports that he has been hospitalized for hypertension and asthma while in [place].He denies any recent asthma exacerbations. Patient reports that he will not take BP medication because it makes him nauseous and dizzy.  Patient reports decreased appetite for "months".  Pain starts in left hip and radiates to his head and down back of leg.  Patient reports that he was breaking the percocet given to him in the ED in half because they were too strong. He reports irregular/hard stools.     Allergies  Allergen Reactions  . Flexeril [Cyclobenzaprine] Nausea And Vomiting  . Motrin [Ibuprofen] Nausea Only  . Naproxen Nausea And Vomiting   Past Medical History  Diagnosis Date  . Back pain   . Hypertension   . Hypercholesteremia   . Migraine   . Asthma   . Hip joint pain Since 2012   Current Outpatient Prescriptions on File Prior to Visit  Medication Sig Dispense Refill  . albuterol (PROVENTIL HFA;VENTOLIN HFA) 108 (90 BASE) MCG/ACT inhaler Inhale 1-2 puffs into the lungs every 6 (six) hours as needed for wheezing or shortness of breath.  1 Inhaler  0  . Alum & Mag Hydroxide-Simeth (MAGIC MOUTHWASH W/LIDOCAINE) SOLN Take 5 mLs by mouth 3 (three) times daily as needed for mouth pain.  60 mL  0  . chlorhexidine (PERIDEX) 0.12 % solution Use as directed 15 mLs in the mouth or throat 2 (two) times daily.  120 mL  0  . hydrochlorothiazide  (HYDRODIURIL) 25 MG tablet Take 1 tablet (25 mg total) by mouth daily.  30 tablet  0  . HYDROcodone-acetaminophen (NORCO/VICODIN) 5-325 MG per tablet Take 1 tablet by mouth every 6 (six) hours as needed for moderate pain.  15 tablet  0  . HYDROcodone-acetaminophen (NORCO/VICODIN) 5-325 MG per tablet Take 2 tablets by mouth every 4 (four) hours as needed for moderate pain.  20 tablet  0  . OVER THE COUNTER MEDICATION Take 1 tablet by mouth at bedtime as needed (sleep). Over the counter sleep aid tablet      . oxyCODONE-acetaminophen (PERCOCET/ROXICET) 5-325 MG per tablet Take 2 tablets by mouth every 4 (four) hours as needed for severe pain.  6 tablet  0  . predniSONE (DELTASONE) 50 MG tablet Take 1 tablet (50 mg total) by mouth daily.  5 tablet  0   No current facility-administered medications on file prior to visit.   No family history on file. History   Social History  . Marital Status: Single    Spouse Name: N/A    Number of Children: N/A  . Years of Education: N/A   Occupational History  . Not on file.   Social History Main Topics  . Smoking status: Never Smoker   . Smokeless tobacco: Not on file  . Alcohol Use: Yes     Comment: wine every  so often  . Drug Use: No  . Sexual Activity: Yes   Other Topics Concern  . Not on file   Social History Narrative  . No narrative on file    Review of Systems: See HPI   Objective:   Filed Vitals:   11/02/13 1629  BP: 147/92  Pulse: 102  Temp: 98.4 F (36.9 C)  Resp: 14   Physical Exam  Vitals reviewed. Constitutional: He is oriented to person, place, and time.  HENT:  Right Ear: External ear normal.  Left Ear: External ear normal.  Nose: Nose normal.  Mouth/Throat: Oropharynx is clear and moist.  Eyes: Conjunctivae and EOM are normal. Pupils are equal, round, and reactive to light.  Neck: Normal range of motion. Neck supple. No JVD present.  Cardiovascular: Normal rate, regular rhythm and normal heart sounds.     Pulmonary/Chest: Effort normal and breath sounds normal.  Abdominal: Soft. Bowel sounds are normal. He exhibits no distension. There is no tenderness.  Musculoskeletal: He exhibits no edema and no tenderness.  Pain with internal and external rotation of bilateral hips  Lymphadenopathy:    He has no cervical adenopathy.  Neurological: He is alert and oriented to person, place, and time.  Skin: Skin is warm and dry.     Lab Results  Component Value Date   WBC 4.1 09/04/2013   HGB 13.4 09/04/2013   HCT 40.4 09/04/2013   MCV 83.1 09/04/2013   PLT 282 09/04/2013   Lab Results  Component Value Date   CREATININE 1.05 09/04/2013   BUN 13 09/04/2013   NA 141 09/04/2013   K 4.4 09/04/2013   CL 100 09/04/2013   CO2 26 09/04/2013    No results found for this basename: HGBA1C   Lipid Panel  No results found for this basename: chol, trig, hdl, cholhdl, vldl, ldlcalc       Assessment and plan:   [name] was seen today for establish care, hypertension and hip pain.  Diagnoses and associated orders for this visit:  HTN (hypertension) - Will start back on hydrochlorothiazide (HYDRODIURIL) 25 MG tablet; Take 1 tablet (25 mg total) by mouth daily and reassess in 2 weeks for a nurse visit.   Constipated - omeprazole (PRILOSEC) 20 MG capsule; Take 1 capsule (20 mg total) by mouth daily take 30 minutes before breakfast to minimize stomach irritation - polyethylene glycol powder (GLYCOLAX/MIRALAX) powder; Take 17 g by mouth daily  Hip pain - acetaminophen-codeine (TYLENOL #3) 300-30 MG per tablet; Take 1 tablet by mouth every 8 (eight) hours as needed for moderate pain.    Return for 2 weeks-BP check, 2 months PCP.   Ambrose FinlandValerie A Orlan Aversa, NP-C [facility] [phone] 11/03/2013, 4:50 PM

## 2013-11-02 NOTE — Patient Instructions (Signed)
DASH Diet  The DASH diet stands for "Dietary Approaches to Stop Hypertension." It is a healthy eating plan that has been shown to reduce high blood pressure (hypertension) in as little as 14 days, while also possibly providing other significant health benefits. These other health benefits include reducing the risk of breast cancer after menopause and reducing the risk of type 2 diabetes, heart disease, colon cancer, and stroke. Health benefits also include weight loss and slowing kidney failure in patients with chronic kidney disease.   DIET GUIDELINES  · Limit salt (sodium). Your diet should contain less than 1500 mg of sodium daily.  · Limit refined or processed carbohydrates. Your diet should include mostly whole grains. Desserts and added sugars should be used sparingly.  · Include small amounts of heart-healthy fats. These types of fats include nuts, oils, and tub margarine. Limit saturated and trans fats. These fats have been shown to be harmful in the body.  CHOOSING FOODS   The following food groups are based on a 2000 calorie diet. See your Registered Dietitian for individual calorie needs.  Grains and Grain Products (6 to 8 servings daily)  · Eat More Often: Whole-wheat bread, brown rice, whole-grain or wheat pasta, quinoa, popcorn without added fat or salt (air popped).  · Eat Less Often: White bread, white pasta, white rice, cornbread.  Vegetables (4 to 5 servings daily)  · Eat More Often: Fresh, frozen, and canned vegetables. Vegetables may be raw, steamed, roasted, or grilled with a minimal amount of fat.  · Eat Less Often/Avoid: Creamed or fried vegetables. Vegetables in a cheese sauce.  Fruit (4 to 5 servings daily)  · Eat More Often: All fresh, canned (in natural juice), or frozen fruits. Dried fruits without added sugar. One hundred percent fruit juice (½ cup [237 mL] daily).  · Eat Less Often: Dried fruits with added sugar. Canned fruit in light or heavy syrup.  Lean Meats, Fish, and Poultry (2  servings or less daily. One serving is 3 to 4 oz [85-114 g]).  · Eat More Often: Ninety percent or leaner ground beef, tenderloin, sirloin. Round cuts of beef, chicken breast, turkey breast. All fish. Grill, bake, or broil your meat. Nothing should be fried.  · Eat Less Often/Avoid: Fatty cuts of meat, turkey, or chicken leg, thigh, or wing. Fried cuts of meat or fish.  Dairy (2 to 3 servings)  · Eat More Often: Low-fat or fat-free milk, low-fat plain or light yogurt, reduced-fat or part-skim cheese.  · Eat Less Often/Avoid: Milk (whole, 2%). Whole milk yogurt. Full-fat cheeses.  Nuts, Seeds, and Legumes (4 to 5 servings per week)  · Eat More Often: All without added salt.  · Eat Less Often/Avoid: Salted nuts and seeds, canned beans with added salt.  Fats and Sweets (limited)  · Eat More Often: Vegetable oils, tub margarines without trans fats, sugar-free gelatin. Mayonnaise and salad dressings.  · Eat Less Often/Avoid: Coconut oils, palm oils, butter, stick margarine, cream, half and half, cookies, candy, pie.  FOR MORE INFORMATION  The Dash Diet Eating Plan: www.dashdiet.org  Document Released: 05/03/2011 Document Revised: 08/06/2011 Document Reviewed: 05/03/2011  [employer]® Patient Information ©2014 [employer].

## 2013-11-02 NOTE — Progress Notes (Signed)
Patient is here to establish care for HTN States left hip pain since 2012 Also C/O hard stools for 1 week  States 1 year history of mouth pain, worse in last 2 months.

## 2013-11-04 ENCOUNTER — Telehealth: Payer: Self-pay | Admitting: Emergency Medicine

## 2013-11-04 NOTE — Telephone Encounter (Signed)
Attempted to reach pt to check on last visit here.will try again to reach pt

## 2013-11-04 NOTE — Telephone Encounter (Signed)
Message copied by Darlis Loan on Wed Nov 04, 2013  9:38 AM ------      Message from: [facility] A      Created: Tue Nov 03, 2013  9:50 PM      Regarding: f/u with patient       Call patient and make sure he was able to pick up stomach and BP medication. Find out if he needed asthma inhaler.  Stress to him that I said he needs to take BP medication and come to nurse visit.            Dr. Hyman Hopes wants [place] to call patients and check on them to minimize patient phone calls to clinic. Thanks ------

## 2013-12-12 IMAGING — CT CT CERVICAL SPINE W/O CM
2 of 4 series · 6 of 14 positions shown, 7 images · non-contrast
Comparison: None.

CT HEAD

CLINICAL DATA: The patient complained of headache and subsequent
had a seizure.  The patient fell to the ground.

CT HEAD WITHOUT CONTRAST
CT CERVICAL SPINE WITHOUT CONTRAST
TECHNIQUE: Multidetector CT imaging of the head and cervical spine
was performed following the standard protocol without intravenous
contrast.  Multiplanar CT image reconstructions of the cervical
spine were also generated.

[Series 5: c-spine st · axial · 0.30mm/px · z∈[-162,-76]mm · 3 of 87 slices shown]
[im 22/87  bone]
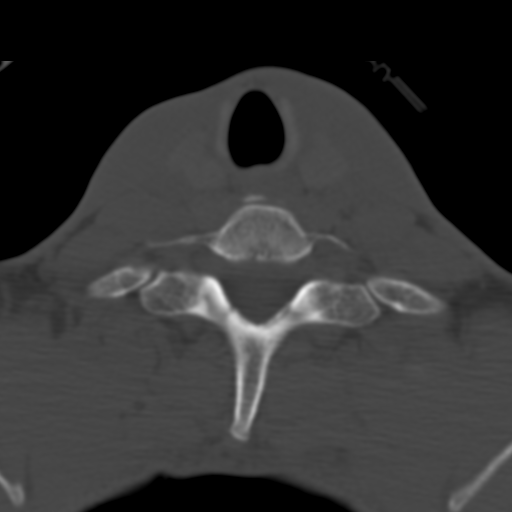
[im 44/87  bone]
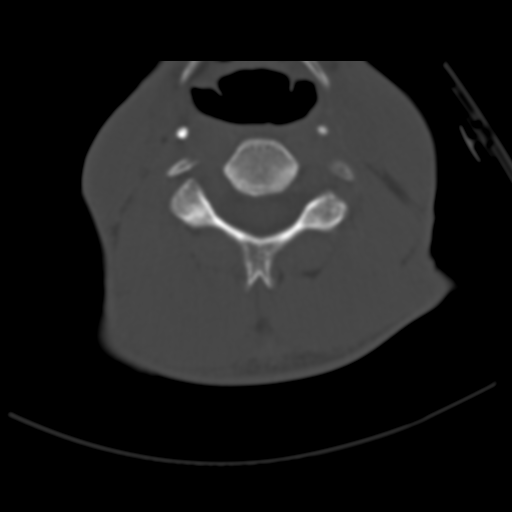
[im 65/87  bone]
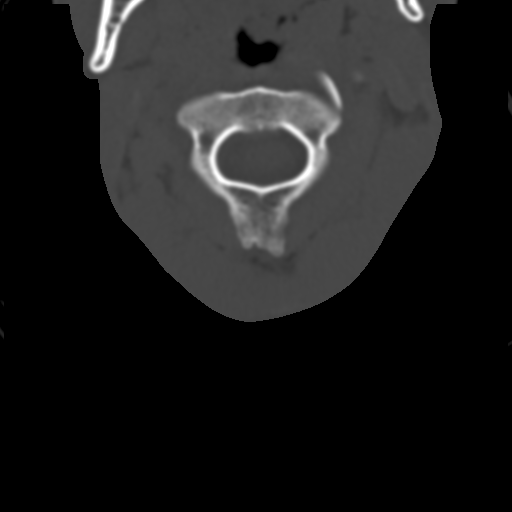

[Series 7: axial recon · axial · 0.23mm/px · z∈[-185,-101]mm · 3 of 92 slices shown, 4 images]
[im 23/92  soft-tissue]
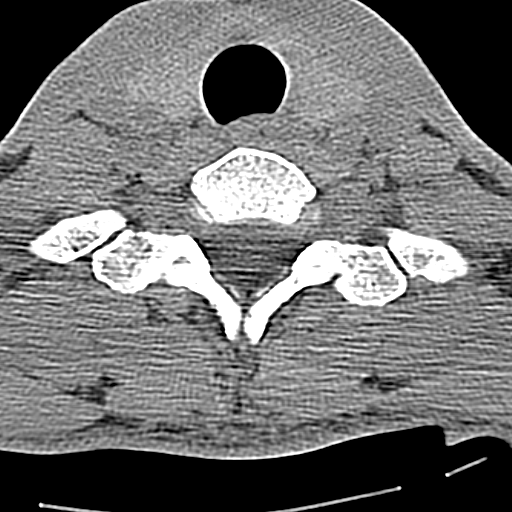
[im 23/92  bone]
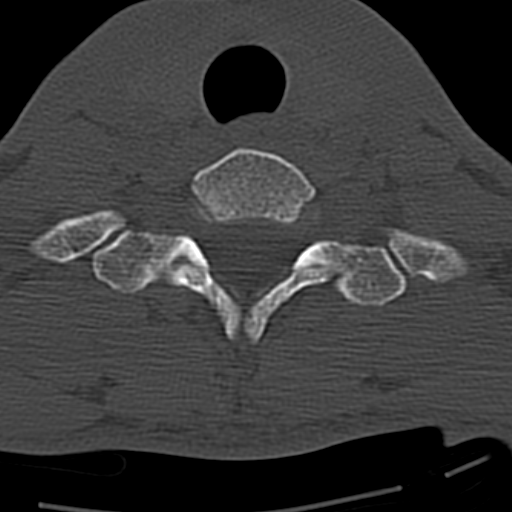
[im 46/92  bone]
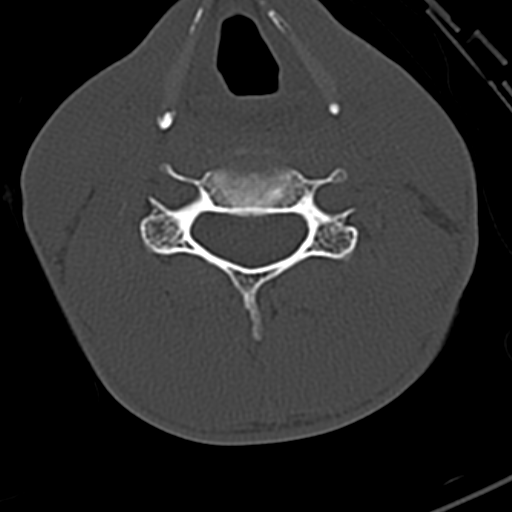
[im 69/92  bone]
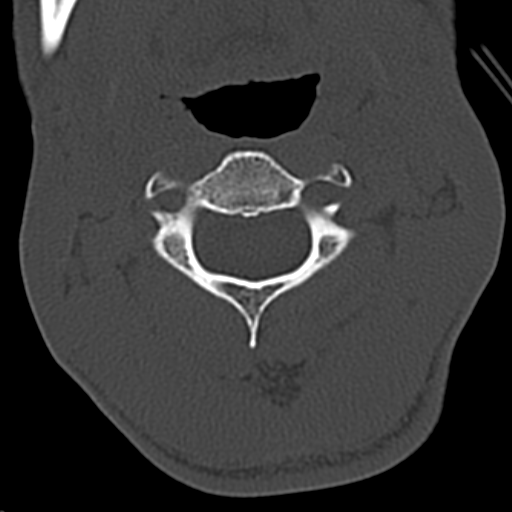

[6 of 14 positions shown; findings below may reference images not displayed]

FINDINGS: The ventricles and sulci are symmetrical without
significant effacement, displacement, or dilatation. No mass effect
or midline shift. No abnormal extra-axial fluid collections. The
grey-white matter junction is distinct. Basal cisterns are not
effaced. No acute intracranial hemorrhage. No depressed skull
fractures.  Visualized paranasal sinuses and mastoid air cells are
not opacified.
IMPRESSION: No acute intracranial abnormalities.

CT CERVICAL SPINE
FINDINGS: Straightening of the usual cervical lordosis which may be
due to patient positioning but ligamentous injury or muscle spasm
are not excluded.  Lateral masses of C1 appear symmetrical.  The
odontoid process appears intact.  No vertebral compression
deformities.  Intervertebral disc space heights are preserved.  No
prevertebral soft tissue swelling.  Tiny osseous fragment anterior
to the C6-7 disc space is likely to represent old ununited ossicle.
No focal bone lesion or bone destruction.  Bone cortex and
trabecular architecture appear intact.
IMPRESSION: Straightening of the usual cervical lordosis.  No displaced
cervical fractures identified.

## 2014-01-01 ENCOUNTER — Ambulatory Visit: Payer: Medicaid Other | Admitting: Internal Medicine

## 2014-01-14 ENCOUNTER — Ambulatory Visit: Payer: Medicaid Other | Admitting: Internal Medicine

## 2014-01-20 ENCOUNTER — Ambulatory Visit: Payer: Medicaid Other | Admitting: Internal Medicine

## 2014-02-20 ENCOUNTER — Emergency Department (HOSPITAL_COMMUNITY): Payer: Medicaid Other

## 2014-02-20 ENCOUNTER — Encounter (HOSPITAL_COMMUNITY): Payer: Self-pay | Admitting: Emergency Medicine

## 2014-02-20 ENCOUNTER — Emergency Department (HOSPITAL_COMMUNITY)
Admission: EM | Admit: 2014-02-20 | Discharge: 2014-02-20 | Disposition: A | Discharge status: Home / Self Care | Payer: Medicaid Other | Attending: Emergency Medicine | Admitting: Emergency Medicine

## 2014-02-20 DIAGNOSIS — Z862 Personal history of diseases of the blood and blood-forming organs and certain disorders involving the immune mechanism: Secondary | ICD-10-CM | POA: Insufficient documentation

## 2014-02-20 DIAGNOSIS — Z79899 Other long term (current) drug therapy: Secondary | ICD-10-CM | POA: Diagnosis not present

## 2014-02-20 DIAGNOSIS — I10 Essential (primary) hypertension: Secondary | ICD-10-CM | POA: Diagnosis not present

## 2014-02-20 DIAGNOSIS — R079 Chest pain, unspecified: Secondary | ICD-10-CM | POA: Diagnosis not present

## 2014-02-20 DIAGNOSIS — R9431 Abnormal electrocardiogram [ECG] [EKG]: Secondary | ICD-10-CM | POA: Diagnosis not present

## 2014-02-20 DIAGNOSIS — R51 Headache: Secondary | ICD-10-CM | POA: Insufficient documentation

## 2014-02-20 DIAGNOSIS — R42 Dizziness and giddiness: Secondary | ICD-10-CM | POA: Insufficient documentation

## 2014-02-20 DIAGNOSIS — Z8639 Personal history of other endocrine, nutritional and metabolic disease: Secondary | ICD-10-CM | POA: Insufficient documentation

## 2014-02-20 DIAGNOSIS — J45909 Unspecified asthma, uncomplicated: Secondary | ICD-10-CM | POA: Insufficient documentation

## 2014-02-20 LAB — I-STAT CHEM 8, ED
BUN: 13 mg/dL (ref 6–23)
CALCIUM ION: 1.15 mmol/L (ref 1.12–1.23)
CHLORIDE: 102 meq/L (ref 96–112)
CREATININE: 0.8 mg/dL (ref 0.50–1.35)
GLUCOSE: 94 mg/dL (ref 70–99)
HCT: 47 % (ref 39.0–52.0)
Hemoglobin: 16 g/dL (ref 13.0–17.0)
Potassium: 5.1 mEq/L (ref 3.7–5.3)
Sodium: 141 mEq/L (ref 137–147)
TCO2: 30 mmol/L (ref 0–100)

## 2014-02-20 LAB — BASIC METABOLIC PANEL
Anion gap: 12 (ref 5–15)
BUN: 11 mg/dL (ref 6–23)
CO2: 26 mEq/L (ref 19–32)
Calcium: 9.6 mg/dL (ref 8.4–10.5)
Chloride: 102 mEq/L (ref 96–112)
Creatinine, Ser: 0.75 mg/dL (ref 0.50–1.35)
GFR calc non Af Amer: >90 mL/min (ref >90)
GLUCOSE: 93 mg/dL (ref 70–99)
Potassium: 4.4 mEq/L (ref 3.7–5.3)
Sodium: 140 mEq/L (ref 137–147)

## 2014-02-20 LAB — CBC
HEMATOCRIT: 40.2 % (ref 39.0–52.0)
Hemoglobin: 13 g/dL (ref 13.0–17.0)
MCH: 26.9 pg (ref 26.0–34.0)
MCHC: 32.3 g/dL (ref 30.0–36.0)
MCV: 83.2 fL (ref 78.0–100.0)
Platelets: 278 10*3/uL (ref 150–400)
RBC: 4.83 MIL/uL (ref 4.22–5.81)
RDW: 15.2 % (ref 11.5–15.5)
WBC: 4 10*3/uL (ref 4.0–10.5)

## 2014-02-20 LAB — RAPID URINE DRUG SCREEN, HOSP PERFORMED
Amphetamines: NOT DETECTED
BARBITURATES: NOT DETECTED
BENZODIAZEPINES: NOT DETECTED
COCAINE: NOT DETECTED
Opiates: NOT DETECTED
Tetrahydrocannabinol: NOT DETECTED

## 2014-02-20 LAB — I-STAT TROPONIN, ED
TROPONIN I, POC: 0 ng/mL (ref 0.00–0.08)
Troponin i, poc: 0.01 ng/mL (ref 0.00–0.08)

## 2014-02-20 MED ORDER — KETOROLAC TROMETHAMINE 30 MG/ML IJ SOLN
30.0000 mg | Freq: Once | INTRAMUSCULAR | Status: AC
  Administered 2014-02-20: 30 mg via INTRAVENOUS
  Filled 2014-02-20: qty 1

## 2014-02-20 MED ORDER — ONDANSETRON HCL 4 MG/2ML IJ SOLN
4.0000 mg | Freq: Once | INTRAMUSCULAR | Status: AC
  Administered 2014-02-20: 4 mg via INTRAVENOUS
  Filled 2014-02-20: qty 2

## 2014-02-20 MED ORDER — HYDRALAZINE HCL 20 MG/ML IJ SOLN
10.0000 mg | Freq: Once | INTRAMUSCULAR | Status: AC
  Administered 2014-02-20: 10 mg via INTRAVENOUS
  Filled 2014-02-20: qty 1

## 2014-02-20 MED ORDER — ASPIRIN 81 MG PO CHEW
324.0000 mg | CHEWABLE_TABLET | Freq: Once | ORAL | Status: AC
  Administered 2014-02-20: 324 mg via ORAL
  Filled 2014-02-20: qty 4

## 2014-02-20 MED ORDER — HYDROCHLOROTHIAZIDE 25 MG PO TABS: 25.0000 mg | ORAL_TABLET | Freq: Every day | ORAL | Status: DC

## 2014-02-20 MED ORDER — HYDROCHLOROTHIAZIDE 12.5 MG PO TABS: 25.0000 mg | ORAL_TABLET | Freq: Every day | ORAL | Status: DC

## 2014-02-20 MED ORDER — LORAZEPAM 2 MG/ML IJ SOLN
1.0000 mg | Freq: Once | INTRAMUSCULAR | Status: AC
  Administered 2014-02-20: 1 mg via INTRAVENOUS
  Filled 2014-02-20: qty 1

## 2014-02-20 NOTE — ED Notes (Signed)
Pt c/o sudden onset numbness/ weakness on one side of face, states it feels like it did before he had a stroke in the past. PA Abigail aware.

## 2014-02-20 NOTE — ED Notes (Addendum)
Pt states he has been having an increase in chest pain and states "feels like my blood pressure is very high". Pt states chest pain has been radiating from left side to mid center chest area. Pt also states he has been having a headache radiating to his lower back. Pt states he has not been taking his blood pressure medications regularly. Pt is calm and family is present at this time.

## 2014-02-20 NOTE — ED Provider Notes (Signed)
CSN: [id number]     Arrival date & time 02/20/14  1545 History   First MD Initiated Contact with Patient 02/20/14 1613     Chief Complaint  Patient presents with  . Hypertension  . Headache  . Chest Pain  . Dizziness     (Consider location/radiation/quality/duration/timing/severity/associated sxs/prior Treatment) HPI Milt Coye is a 38 y.o. male who presents today with complaints of back pain, headaches, chest pain, and high blood pressure. Headaches have been occuring for the past month on and off in the mornings and during the day. Notes a throbbing sensation across forehead that gets worse with movement. Ibuprofen does not relieve any of the pain from the headaches. Back pain is located in lumbar region bilaterally. Patient notes having a history of degenerative disc disease and sciatica. Chest pain is located centrally and gets worse with taking deep breaths. Diagnosed with hypertension and was on lisinopril but that upset his stomach he then saw [facility] back in June of this year who put him on HCTZ 25 mg daily. Patient notes he stopped taking the HCTZ three weeks ago because it was upsetting his stomach as well.  Past Medical History  Diagnosis Date  . Back pain   . Hypertension   . Hypercholesteremia   . Migraine   . Asthma   . Hip joint pain Since 2012   History reviewed. No pertinent past surgical history. No family history on file. History  Substance Use Topics  . Smoking status: Never Smoker   . Smokeless tobacco: Not on file  . Alcohol Use: Yes     Comment: wine every so often    Review of Systems  Ten systems reviewed and are negative for acute change, except as noted in the HPI.    Allergies  Flexeril; Motrin; and Naproxen  Home Medications   Prior to Admission medications   Medication Sig Start Date End Date Taking? Authorizing Provider  acetaminophen-codeine (TYLENOL #3) 300-30 MG per tablet Take 1 tablet by mouth every 8 (eight) hours as needed  for moderate pain. 11/02/13   [name], NP  albuterol (PROVENTIL HFA;VENTOLIN HFA) 108 (90 BASE) MCG/ACT inhaler Inhale 1-2 puffs into the lungs every 6 (six) hours as needed for wheezing or shortness of breath. 09/20/13   Elson Areas, PA-C  Alum & Mag Hydroxide-Simeth (MAGIC MOUTHWASH W/LIDOCAINE) SOLN Take 5 mLs by mouth 3 (three) times daily as needed for mouth pain. 10/24/13   Jillyn Ledger, PA-C  chlorhexidine (PERIDEX) 0.12 % solution Use as directed 15 mLs in the mouth or throat 2 (two) times daily. 10/24/13   Jillyn Ledger, PA-C  hydrochlorothiazide (HYDRODIURIL) 25 MG tablet Take 1 tablet (25 mg total) by mouth daily. 11/02/13   [name], NP  HYDROcodone-acetaminophen (NORCO/VICODIN) 5-325 MG per tablet Take 1 tablet by mouth every 6 (six) hours as needed for moderate pain. 09/04/13   Carlyle Dolly, PA-C  HYDROcodone-acetaminophen (NORCO/VICODIN) 5-325 MG per tablet Take 2 tablets by mouth every 4 (four) hours as needed for moderate pain. 09/20/13   Elson Areas, PA-C  omeprazole (PRILOSEC) 20 MG capsule Take 1 capsule (20 mg total) by mouth daily. 11/02/13   [name], NP  OVER THE COUNTER MEDICATION Take 1 tablet by mouth at bedtime as needed (sleep). Over the counter sleep aid tablet    Historical Provider, MD  oxyCODONE-acetaminophen (PERCOCET/ROXICET) 5-325 MG per tablet Take 2 tablets by mouth every 4 (four) hours as needed for severe  pain. 10/24/13   Jillyn Ledger, PA-C  polyethylene glycol powder (GLYCOLAX/MIRALAX) powder Take 17 g by mouth daily. 11/02/13   [name], NP  predniSONE (DELTASONE) 50 MG tablet Take 1 tablet (50 mg total) by mouth daily. 09/04/13   Jamesetta Orleans Lawyer, PA-C   BP 157/106  Pulse 115  Temp(Src) 98.9 F (37.2 C) (Oral)  Resp 19  Ht  (1.702 m)  Wt 130 lb (58.968 kg)  BMI 20.36 kg/m2  SpO2 99% Physical Exam  Nursing note and vitals reviewed. Constitutional: He appears well-developed and well-nourished. No distress.   HENT:  Head: Normocephalic and atraumatic.  Eyes: Conjunctivae are normal. No scleral icterus.  Neck: Normal range of motion. Neck supple.  Cardiovascular: Normal rate, regular rhythm and normal heart sounds.   Pulmonary/Chest: Effort normal and breath sounds normal. No respiratory distress.  Abdominal: Soft. There is no tenderness.  Musculoskeletal: He exhibits no edema.  Neurological: He is alert.  Skin: Skin is warm and dry. He is not diaphoretic.  Psychiatric:  anxious    ED Course  Procedures (including critical care time) Labs Review Labs Reviewed  CBC  URINE RAPID DRUG SCREEN (HOSP PERFORMED)  I-STAT TROPOININ, ED    Imaging Review No results found.   EKG Interpretation   Date/Time:  Saturday February 20 2014 15:53:35 EDT Ventricular Rate:  113 PR Interval:  129 QRS Duration: 83 QT Interval:  312 QTC Calculation: 428 R Axis:   84 Text Interpretation:  Sinus tachycardia T wave inversion Inferior leads  Anterior leads with unchanged st elevation from 09/04/13 Confirmed by RAY  MD, Duwayne Heck [phone]) on 02/20/2014 8:26:36 PM      MDM   Final diagnoses:  Chest pain, unspecified chest pain type  Essential hypertension  T wave inversion in EKG   Patient here with c/o cp, chronic back pain , chronic headaches.  Noncompliant with his blood pressure meds. He has intermittent chest pain which he describes as fleeting. The patient describes an episode of substernal, burning chest pain with associated diaphoresis 2 days ago that lasted approximately 1 hour. He tried drinking water without relief. Denies nausea, vomiting sob or pain int he left shoulder or jaw.     6:10 PM Patient c/o sudden onset facial numbness. He states that he is having associated dysarthria. He states that this happens to him occasionally, but this is the worst it has been in a while. He denies UL weakness, headache, nausea, difficulty swallowing or changes in vision.    8:28 PM EKG with T  wave inversion in inferior leads and changes from previous. Negative Head CT. Hydralazine given and he has had 2 negative troponins.  I have spoken with Dr. Toniann Fail who will admit the patient. i will consult with the neurologist.    Patient seen by Dr. Toniann Fail but has decided to leave AMA. Dr. Rosalia Hammers and I spoke with the patient about risks of leaving.  We discussed his EKG findings. Labs. We also discussed return precautions and reasons to seek immediate medical care in the ED.  Date: 02/20/2014 Patient: Jordan Schaefer Admitted: 02/20/2014  3:46 PM Attending Provider: Hilario Quarry, MD  Jacklyn Shell or his authorized caregiver has made the decision for the patient to leave the emergency department against the advice of Hilario Quarry, MD.  He or his authorized caregiver has been informed and understands the inherent risks, including death.  He or his authorized caregiver has decided to accept the responsibility for this decision.  Jacklyn Shell and all necessary parties have been advised that he may return for further evaluation or treatment. His condition at time of discharge was Stable.  Jacklyn Shell had current vital signs as follows:  Blood pressure 162/112, pulse 96, temperature 98.8 F (37.1 C), temperature source Oral, resp. rate 18, height  (1.702 m), weight 130 lb (58.968 kg), SpO2 100.00%.   Jacklyn Shell or his authorized caregiver has signed the Leaving Against Medical Advice form prior to leaving the department.  Arthor Captain 02/20/2014         Arthor Captain, PA-C 02/20/14 2214

## 2014-02-20 NOTE — ED Notes (Signed)
Patient stated that he could not be admitted due to his wife is sick at home. Explained to the patient the risk of leaving. Patient ask for prescription for blood pressure medicine. Dr. Jovita Gamma patient enough for the weekend. The patient was told that is very important that he follow up with his physician on Monday morning.

## 2014-02-20 NOTE — Discharge Instructions (Signed)
Discharge Against Medical Advice I am signing this paper to show that I am leaving this hospital or health care center of my own free will. It is done against all medical advice. In doing so, I am releasing this hospital or health care center and the attending physicians from any and all claims that I may want to make. I understand that further care has been recommended. My condition may worsen. This could cause me further bodily injury, illness, or even death. I do know that the medical staff has fully explained to me the risk that I am taking in leaving against medical advice. Document Released: 05/14/2005 Document Revised: 08/06/2011 Document Reviewed: 10/29/2006 [facility] Patient Information 2015 [place], [place]. This information is not intended to replace advice given to you by your health care provider. Make sure you discuss any questions you have with your health care provider.  Your EKG shows changes that may be an early warning of a heart attack. You are advised to stay in the hospital for observation and further lab testing. You are advised to TAKE YOUR BLOOD PRESSURE MEDICATIONS! Please follow up with your primary care doctor as soon as possible.  Abdominal (belly) pain can be caused by many things. Your caregiver performed an examination and possibly ordered blood/urine tests and imaging (CT scan, x-rays, ultrasound). Many cases can be observed and treated at home after initial evaluation in the emergency department. Even though you are being discharged home, abdominal pain can be unpredictable. Therefore, you need a repeated exam if your pain does not resolve, returns, or worsens. Most patients with abdominal pain don't have to be admitted to the hospital or have surgery, but serious problems like appendicitis and gallbladder attacks can start out as nonspecific pain. Many abdominal conditions cannot be diagnosed in one visit, so follow-up evaluations are very important. SEEK IMMEDIATE MEDICAL  ATTENTION IF: The pain does not go away or becomes severe.  A temperature above 101 develops.  Repeated vomiting occurs (multiple episodes).  The pain becomes localized to portions of the abdomen. The right side could possibly be appendicitis. In an adult, the left lower portion of the abdomen could be colitis or diverticulitis.  Blood is being passed in stools or vomit (bright red or black tarry stools).  Return also if you develop chest pain, difficulty breathing, dizziness or fainting, or become confused, poorly responsive, or inconsolable (young children).  You may have had a transient ischemic attack (TIA). This means that the nervous system did not work properly for a short time. It is caused by a low oxygen supply to an area of your brain. It may be caused by a small blood clot or hardening of the arteries. This is a temporary neurologic condition. It usually gets better within thirty minutes, but always within twenty-four hours. If this does not resolve within that time period, it is defined as a stroke. TIA's are warning signs that you are at risk for having a stroke. A small percentage of patients who have had a TIA will have a stroke within a couple days, and up to 20% will have a stroke within 3 months. SEEK IMMEDIATE MEDICAL ATTENTION (Call 911) IF: The original symptoms that brought you in are getting worse, or if you develop any new change in speech, vision, swallowing, or understanding, incoordination, weakness, numbness, tingling, dizziness, fainting, severe headache, chest pain, or other concerns. Some patients who are having a stroke are eligible to receive a medication which may improve their outcome, but the  drug usually must be given within three hours from when symptoms first occur. So if you think you might be having stroke symptoms, don't wait, call 911.

## 2014-02-21 NOTE — ED Provider Notes (Signed)
38 y.o. Male with multiple complaints including episodic chest pain.  EKG here with t wave inversion inferior leads.  PE WDWN male thin nad   I performed a history and physical examination of Jordan Schaefer and discussed his management with Ms. Harris.  I agree with the history, physical, assessment, and plan of care, with the following exceptions: None  I was present for the following procedures: None Time Spent in Critical Care of the patient: None Time spent in discussions with the patient and family: 10  Stashia Sia Corlis Leak, MD 02/21/14 2333

## 2014-03-16 ENCOUNTER — Encounter: Payer: Self-pay | Admitting: Internal Medicine

## 2014-03-16 ENCOUNTER — Ambulatory Visit: Payer: Medicaid Other | Attending: Internal Medicine | Admitting: Internal Medicine

## 2014-03-16 VITALS — BP 145/93 | HR 110 | Temp 98.8°F | Resp 16 | Wt 120.0 lb

## 2014-03-16 DIAGNOSIS — Z8249 Family history of ischemic heart disease and other diseases of the circulatory system: Secondary | ICD-10-CM | POA: Diagnosis not present

## 2014-03-16 DIAGNOSIS — I10 Essential (primary) hypertension: Secondary | ICD-10-CM | POA: Diagnosis not present

## 2014-03-16 DIAGNOSIS — Z9114 Patient's other noncompliance with medication regimen: Secondary | ICD-10-CM | POA: Diagnosis not present

## 2014-03-16 DIAGNOSIS — F17210 Nicotine dependence, cigarettes, uncomplicated: Secondary | ICD-10-CM | POA: Diagnosis not present

## 2014-03-16 DIAGNOSIS — K068 Other specified disorders of gingiva and edentulous alveolar ridge: Secondary | ICD-10-CM | POA: Diagnosis not present

## 2014-03-16 DIAGNOSIS — R9431 Abnormal electrocardiogram [ECG] [EKG]: Secondary | ICD-10-CM | POA: Insufficient documentation

## 2014-03-16 MED ORDER — MAGIC MOUTHWASH W/LIDOCAINE: 5.0000 mL | Freq: Three times a day (TID) | ORAL | Status: AC | PRN

## 2014-03-16 MED ORDER — AMLODIPINE BESYLATE 10 MG PO TABS: 10.0000 mg | ORAL_TABLET | Freq: Every day | ORAL | Status: DC

## 2014-03-16 NOTE — Patient Instructions (Signed)
Hypertension Hypertension, commonly called high blood pressure, is when the force of blood pumping through your arteries is too strong. Your arteries are the blood vessels that carry blood from your heart throughout your body. A blood pressure reading consists of a higher number over a lower number, such as 110/72. The higher number (systolic) is the pressure inside your arteries when your heart pumps. The lower number (diastolic) is the pressure inside your arteries when your heart relaxes. Ideally you want your blood pressure below 120/80. Hypertension forces your heart to work harder to pump blood. Your arteries may become narrow or stiff. Having hypertension puts you at risk for heart disease, stroke, and other problems.  RISK FACTORS Some risk factors for high blood pressure are controllable. Others are not.  Risk factors you cannot control include:   Race. You may be at higher risk if you are African American.  Age. Risk increases with age.  Gender. Men are at higher risk than women before age 45 years. After age 65, women are at higher risk than men. Risk factors you can control include:  Not getting enough exercise or physical activity.  Being overweight.  Getting too much fat, sugar, calories, or salt in your diet.  Drinking too much alcohol. SIGNS AND SYMPTOMS Hypertension does not usually cause signs or symptoms. Extremely high blood pressure (hypertensive crisis) may cause headache, anxiety, shortness of breath, and nosebleed. DIAGNOSIS  To check if you have hypertension, your health care provider will measure your blood pressure while you are seated, with your arm held at the level of your heart. It should be measured at least twice using the same arm. Certain conditions can cause a difference in blood pressure between your right and left arms. A blood pressure reading that is higher than normal on one occasion does not mean that you need treatment. If one blood pressure reading  is high, ask your health care provider about having it checked again. TREATMENT  Treating high blood pressure includes making lifestyle changes and possibly taking medicine. Living a healthy lifestyle can help lower high blood pressure. You may need to change some of your habits. Lifestyle changes may include:  Following the DASH diet. This diet is high in fruits, vegetables, and whole grains. It is low in salt, red meat, and added sugars.  Getting at least 2 hours of brisk physical activity every week.  Losing weight if necessary.  Not smoking.  Limiting alcoholic beverages.  Learning ways to reduce stress. If lifestyle changes are not enough to get your blood pressure under control, your health care provider may prescribe medicine. You may need to take more than one. Work closely with your health care provider to understand the risks and benefits. HOME CARE INSTRUCTIONS  Have your blood pressure rechecked as directed by your health care provider.   Take medicines only as directed by your health care provider. Follow the directions carefully. Blood pressure medicines must be taken as prescribed. The medicine does not work as well when you skip doses. Skipping doses also puts you at risk for problems.   Do not smoke.   Monitor your blood pressure at home as directed by your health care provider. SEEK MEDICAL CARE IF:   You think you are having a reaction to medicines taken.  You have recurrent headaches or feel dizzy.  You have swelling in your ankles.  You have trouble with your vision. SEEK IMMEDIATE MEDICAL CARE IF:  You develop a severe headache or confusion.    You have unusual weakness, numbness, or feel faint.  You have severe chest or abdominal pain.  You vomit repeatedly.  You have trouble breathing. MAKE SURE YOU:   Understand these instructions.  Will watch your condition.  Will get help right away if you are not doing well or get worse. Document  Released: 05/14/2005 Document Revised: 09/28/2013 Document Reviewed: 03/06/2013 ExitCare Patient Information 2015 [employer]. This information is not intended to replace advice given to you by your health care provider. Make sure you discuss any questions you have with your health care provider.   Smoking Cessation Quitting smoking is important to your health and has many advantages. However, it is not always easy to quit since nicotine is a very addictive drug. Oftentimes, people try 3 times or more before being able to quit. This document explains the best ways for you to prepare to quit smoking. Quitting takes hard work and a lot of effort, but you can do it. ADVANTAGES OF QUITTING SMOKING  You will live longer, feel better, and live better.  Your body will feel the impact of quitting smoking almost immediately.  Within 20 minutes, blood pressure decreases. Your pulse returns to its normal level.  After 8 hours, carbon monoxide levels in the blood return to normal. Your oxygen level increases.  After 24 hours, the chance of having a heart attack starts to decrease. Your breath, hair, and body stop smelling like smoke.  After 48 hours, damaged nerve endings begin to recover. Your sense of taste and smell improve.  After 72 hours, the body is virtually free of nicotine. Your bronchial tubes relax and breathing becomes easier.  After 2 to 12 weeks, lungs can hold more air. Exercise becomes easier and circulation improves.  The risk of having a heart attack, stroke, cancer, or lung disease is greatly reduced.  After 1 year, the risk of coronary heart disease is cut in half.  After 5 years, the risk of stroke falls to the same as a nonsmoker.  After 10 years, the risk of lung cancer is cut in half and the risk of other cancers decreases significantly.  After 15 years, the risk of coronary heart disease drops, usually to the level of a nonsmoker.  If you are pregnant, quitting  smoking will improve your chances of having a healthy baby.  The people you live with, especially any children, will be healthier.  You will have extra money to spend on things other than cigarettes. QUESTIONS TO THINK ABOUT BEFORE ATTEMPTING TO QUIT You may want to talk about your answers with your health care provider.  Why do you want to quit?  If you tried to quit in the past, what helped and what did not?  What will be the most difficult situations for you after you quit? How will you plan to handle them?  Who can help you through the tough times? Your family? Friends? A health care provider?  What pleasures do you get from smoking? What ways can you still get pleasure if you quit? Here are some questions to ask your health care provider:  How can you help me to be successful at quitting?  What medicine do you think would be best for me and how should I take it?  What should I do if I need more help?  What is smoking withdrawal like? How can I get information on withdrawal? GET READY  Set a quit date.  Change your environment by getting rid of all   cigarettes, ashtrays, matches, and lighters in your home, car, or work. Do not let people smoke in your home.  Review your past attempts to quit. Think about what worked and what did not. GET SUPPORT AND ENCOURAGEMENT You have a better chance of being successful if you have help. You can get support in many ways.  Tell your family, friends, and coworkers that you are going to quit and need their support. Ask them not to smoke around you.  Get individual, group, or telephone counseling and support. Programs are available at local hospitals and health centers. Call your local health department for information about programs in your area.  Spiritual beliefs and practices may help some smokers quit.  Download a "quit meter" on your computer to keep track of quit statistics, such as how long you have gone without smoking,  cigarettes not smoked, and money saved.  Get a self-help book about quitting smoking and staying off tobacco. LEARN NEW SKILLS AND BEHAVIORS  Distract yourself from urges to smoke. Talk to someone, go for a walk, or occupy your time with a task.  Change your normal routine. Take a different route to work. Drink tea instead of coffee. Eat breakfast in a different place.  Reduce your stress. Take a hot bath, exercise, or read a book.  Plan something enjoyable to do every day. Reward yourself for not smoking.  Explore interactive web-based programs that specialize in helping you quit. GET MEDICINE AND USE IT CORRECTLY Medicines can help you stop smoking and decrease the urge to smoke. Combining medicine with the above behavioral methods and support can greatly increase your chances of successfully quitting smoking.  Nicotine replacement therapy helps deliver nicotine to your body without the negative effects and risks of smoking. Nicotine replacement therapy includes nicotine gum, lozenges, inhalers, nasal sprays, and skin patches. Some may be available over-the-counter and others require a prescription.  Antidepressant medicine helps people abstain from smoking, but how this works is unknown. This medicine is available by prescription.  Nicotinic receptor partial agonist medicine simulates the effect of nicotine in your brain. This medicine is available by prescription. Ask your health care provider for advice about which medicines to use and how to use them based on your health history. Your health care provider will tell you what side effects to look out for if you choose to be on a medicine or therapy. Carefully read the information on the package. Do not use any other product containing nicotine while using a nicotine replacement product.  RELAPSE OR DIFFICULT SITUATIONS Most relapses occur within the first 3 months after quitting. Do not be discouraged if you start smoking again. Remember,  most people try several times before finally quitting. You may have symptoms of withdrawal because your body is used to nicotine. You may crave cigarettes, be irritable, feel very hungry, cough often, get headaches, or have difficulty concentrating. The withdrawal symptoms are only temporary. They are strongest when you first quit, but they will go away within 10-14 days. To reduce the chances of relapse, try to:  Avoid drinking alcohol. Drinking lowers your chances of successfully quitting.  Reduce the amount of caffeine you consume. Once you quit smoking, the amount of caffeine in your body increases and can give you symptoms, such as a rapid heartbeat, sweating, and anxiety.  Avoid smokers because they can make you want to smoke.  Do not let weight gain distract you. Many smokers will gain weight when they quit, usually less than 10   pounds. Eat a healthy diet and stay active. You can always lose the weight gained after you quit.  Find ways to improve your mood other than smoking. FOR MORE INFORMATION  www.smokefree.gov  Document Released: 05/08/2001 Document Revised: 09/28/2013 Document Reviewed: 08/23/2011 ExitCare Patient Information 2015 [employer]. This information is not intended to replace advice given to you by your health care provider. Make sure you discuss any questions you have with your health care provider.  

## 2014-03-16 NOTE — Progress Notes (Signed)
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  States he has chest pain 3 days/week lasting 2 minutes Denies chest pain at present [facility]

## 2014-03-16 NOTE — Progress Notes (Signed)
Patient ID: Jordan Schaefer, male   DOB: Feb 07, 1976, 38 y.o.   MRN: [id number]  CC: follow up   HPI: Patient presents to clinic today for a follow up of hypertension. He states that he stopped taking the medication shortly after last visit.  He reports that he was evaluated in the ED a couple of weeks ago due to chest pain.  His EKG at that time revealed left ventricular/atrial hypertrophy, ST elevation, and sinus tachycardia.  Patient was encouraged to stay for hospital admission but refused at that time.  He has since then been taking HCTZ and has started back smoking at least five cigarettes per day.  Today he is interested in a nicotine patch to help him stop smoking.  He denies chest pain today but reports that he does have the pain at least three times per week lasting a around two minutes each time.  The pain is noticeable with exertion and not at rest.  Pain often radiates to his left arm causing weakness on the left side of his body.  He would also like a refill on his magic mouthwash due to having sore gums from his dentures.      Allergies  Allergen Reactions  . Flexeril [Cyclobenzaprine] Nausea And Vomiting  . Motrin [Ibuprofen] Nausea Only  . Naproxen Nausea And Vomiting   Past Medical History  Diagnosis Date  . Back pain   . Hypertension   . Hypercholesteremia   . Migraine   . Asthma   . Hip joint pain Since 2012   Current Outpatient Prescriptions on File Prior to Visit  Medication Sig Dispense Refill  . hydrochlorothiazide (HYDRODIURIL) 25 MG tablet Take 1 tablet (25 mg total) by mouth daily.  7 tablet  0   No current facility-administered medications on file prior to visit.   Family History  Problem Relation Age of Onset  . Asthma Sister   . Asthma Brother   . CAD Neg Hx   . Stroke Neg Hx    History   Social History  . Marital Status: Single    Spouse Name: N/A    Number of Children: N/A  . Years of Education: N/A   Occupational History  . Not on file.    Social History Main Topics  . Smoking status: Former Smoker -- 0.25 packs/day    Types: Cigarettes    Start date: 01/14/2014  . Smokeless tobacco: Not on file     Comment: smoking 4-5 cigs per day  . Alcohol Use: No  . Drug Use: No  . Sexual Activity: Yes   Other Topics Concern  . Not on file   Social History Narrative  . No narrative on file    Review of Systems  Respiratory: Negative.   Cardiovascular: Positive for chest pain and palpitations. Negative for claudication and leg swelling.  Neurological: Positive for dizziness. Negative for tingling.     Objective:   Filed Vitals:   03/16/14 1649  BP: 145/93  Pulse: 110  Temp: 98.8 F (37.1 C)  Resp: 16    Physical Exam  HENT:  Mouth/Throat: Oropharynx is clear and moist.  Eyes: Pupils are equal, round, and reactive to light.  Neck: Normal range of motion. No thyromegaly present.  Cardiovascular: Normal rate, regular rhythm and normal heart sounds.   Pulmonary/Chest: Effort normal and breath sounds normal. He has no rales. He exhibits no tenderness.  Musculoskeletal: He exhibits no edema.     Lab Results  Component Value Date  WBC 4.0 02/20/2014   HGB 16.0 02/20/2014   HCT 47.0 02/20/2014   MCV 83.2 02/20/2014   PLT 278 02/20/2014   Lab Results  Component Value Date   CREATININE 0.80 02/20/2014   BUN 13 02/20/2014   NA 141 02/20/2014   K 5.1 02/20/2014   CL 102 02/20/2014   CO2 26 02/20/2014    No results found for this basename: HGBA1C   Lipid Panel  No results found for this basename: chol, trig, hdl, cholhdl, vldl, ldlcalc       Assessment and plan:   Jordan Schaefer was seen today for hypertension and follow-up.  Diagnoses and associated orders for this visit:  Essential hypertension - Discontinue:HCTZ - Begin amLODipine (NORVASC) 10 MG tablet; Take 1 tablet (10 mg total) by mouth daily. For better BP control - Lipid panel; Future  Abnormal EKG - 2D Echocardiogram with contrast; Future  Pain in  gums - Alum & Mag Hydroxide-Simeth (MAGIC MOUTHWASH W/LIDOCAINE) SOLN; Take 5 mLs by mouth 3 (three) times daily as needed for mouth pain. Swish and spit   Explained to patient that he is hurting himself by not taking BP medication. Explained what his ekg showed and that it will only get worse. Explained the need for compliance and all possible consequences.  Will place patient on Dr. Daleen SquibbWall schedule and order ECHO  Holland CommonsKECK, Tex Conroy, NP-C [facility] [phone] 03/22/2014, 10:55 AM

## 2014-03-17 ENCOUNTER — Encounter: Payer: Self-pay | Admitting: *Deleted

## 2014-03-17 NOTE — Progress Notes (Signed)
Pt called stating that the new BP medication that he was put on was giving him even more chest pain. After speaking with his provider she said that the medication shouldn't be causing him chest pain. He said that his chest pain was an 8 on the pain scale and that the pain was running down his left arm and his feet were tingling. I gave him clear instructions for him to have someone drive him to the ED. Pt stated that he would go right now.

## 2014-03-22 ENCOUNTER — Encounter: Payer: Self-pay | Admitting: Internal Medicine

## 2014-03-24 ENCOUNTER — Ambulatory Visit: Payer: Medicaid Other | Admitting: Cardiology

## 2014-04-09 ENCOUNTER — Other Ambulatory Visit: Payer: Self-pay | Admitting: Internal Medicine

## 2014-04-09 ENCOUNTER — Ambulatory Visit (HOSPITAL_COMMUNITY)
Admission: RE | Admit: 2014-04-09 | Discharge: 2014-04-09 | Disposition: A | Discharge status: Home / Self Care | Payer: Medicaid Other | Source: Ambulatory Visit | Attending: Internal Medicine | Admitting: Internal Medicine

## 2014-04-09 DIAGNOSIS — R079 Chest pain, unspecified: Secondary | ICD-10-CM | POA: Insufficient documentation

## 2014-04-09 DIAGNOSIS — R9431 Abnormal electrocardiogram [ECG] [EKG]: Secondary | ICD-10-CM

## 2014-04-09 NOTE — Progress Notes (Signed)
EKG done after echo.  The patient was suggested to go to the ED due to active chest pain, but he did not want to go.

## 2014-04-09 NOTE — Progress Notes (Signed)
  Echocardiogram 2D Echocardiogram has been performed.  Jordan Schaefer, Jordan Schaefer 04/09/2014, 2:26 PM

## 2014-04-30 ENCOUNTER — Encounter (HOSPITAL_COMMUNITY): Payer: Self-pay | Admitting: Emergency Medicine

## 2014-04-30 ENCOUNTER — Emergency Department (HOSPITAL_COMMUNITY)
Admission: EM | Admit: 2014-04-30 | Discharge: 2014-05-01 | Disposition: A | Discharge status: Home / Self Care | Payer: Medicaid Other | Attending: Emergency Medicine | Admitting: Emergency Medicine

## 2014-04-30 DIAGNOSIS — G8929 Other chronic pain: Secondary | ICD-10-CM | POA: Diagnosis not present

## 2014-04-30 DIAGNOSIS — Z87891 Personal history of nicotine dependence: Secondary | ICD-10-CM | POA: Diagnosis not present

## 2014-04-30 DIAGNOSIS — I10 Essential (primary) hypertension: Secondary | ICD-10-CM | POA: Diagnosis present

## 2014-04-30 DIAGNOSIS — Z9114 Patient's other noncompliance with medication regimen: Secondary | ICD-10-CM | POA: Diagnosis not present

## 2014-04-30 DIAGNOSIS — J45901 Unspecified asthma with (acute) exacerbation: Secondary | ICD-10-CM | POA: Insufficient documentation

## 2014-04-30 DIAGNOSIS — R42 Dizziness and giddiness: Secondary | ICD-10-CM | POA: Insufficient documentation

## 2014-04-30 DIAGNOSIS — R079 Chest pain, unspecified: Secondary | ICD-10-CM | POA: Insufficient documentation

## 2014-04-30 DIAGNOSIS — Z91148 Patient's other noncompliance with medication regimen for other reason: Secondary | ICD-10-CM

## 2014-04-30 DIAGNOSIS — Z8639 Personal history of other endocrine, nutritional and metabolic disease: Secondary | ICD-10-CM | POA: Diagnosis not present

## 2014-04-30 LAB — BASIC METABOLIC PANEL
ANION GAP: 11 (ref 5–15)
BUN: 11 mg/dL (ref 6–23)
CALCIUM: 10 mg/dL (ref 8.4–10.5)
CHLORIDE: 102 meq/L (ref 96–112)
CO2: 27 meq/L (ref 19–32)
CREATININE: 0.66 mg/dL (ref 0.50–1.35)
GFR calc Af Amer: >90 mL/min (ref >90)
GFR calc non Af Amer: >90 mL/min (ref >90)
Glucose, Bld: 95 mg/dL (ref 70–99)
Potassium: 4.5 mEq/L (ref 3.7–5.3)
Sodium: 140 mEq/L (ref 137–147)

## 2014-04-30 LAB — CBC WITH DIFFERENTIAL/PLATELET
BASOS ABS: 0 10*3/uL (ref 0.0–0.1)
BASOS PCT: 0 % (ref 0–1)
EOS PCT: 1 % (ref 0–5)
Eosinophils Absolute: 0 10*3/uL (ref 0.0–0.7)
HCT: 38 % — ABNORMAL LOW (ref 39.0–52.0)
Hemoglobin: 12.6 g/dL — ABNORMAL LOW (ref 13.0–17.0)
Lymphocytes Relative: 27 % (ref 12–46)
Lymphs Abs: 1.2 10*3/uL (ref 0.7–4.0)
MCH: 27.6 pg (ref 26.0–34.0)
MCHC: 33.2 g/dL (ref 30.0–36.0)
MCV: 83.2 fL (ref 78.0–100.0)
MONOS PCT: 7 % (ref 3–12)
Monocytes Absolute: 0.3 10*3/uL (ref 0.1–1.0)
Neutro Abs: 2.9 10*3/uL (ref 1.7–7.7)
Neutrophils Relative %: 65 % (ref 43–77)
Platelets: 243 10*3/uL (ref 150–400)
RBC: 4.57 MIL/uL (ref 4.22–5.81)
RDW: 15.4 % (ref 11.5–15.5)
WBC: 4.4 10*3/uL (ref 4.0–10.5)

## 2014-04-30 LAB — TROPONIN I

## 2014-04-30 MED ORDER — ASPIRIN 325 MG PO TABS
325.0000 mg | ORAL_TABLET | Freq: Once | ORAL | Status: AC
  Administered 2014-04-30: 325 mg via ORAL

## 2014-04-30 NOTE — ED Provider Notes (Signed)
CSN: [id number]     Arrival date & time 04/30/14  2217 History  This chart was scribed for Olivia Mackielga M Alton Bouknight, MD by Modena JanskyAlbert Thayil, ED Scribe. This patient was seen in room B17C/B17C and the patient's care was started at 11:20 PM.   Chief Complaint  Patient presents with  . Hypertension  . Abnormal ECG   The history is provided by the patient and the EMS personnel. No language interpreter was used.   HPI Comments: Jordan Schaefer is a 38 y.o. male with a hx of HTN, asthma, and stroke who presents to the Emergency Department complaining of constant moderate left sided chest pain that started yesterday. He states that his usual chest pain worsened today. He reports that the pain intermittently radiates to his left upper extremity. He describes the pain as a sharp sensation. He reports that lying down a certain way exacerbates the pain. EMS states that pt was hypertensive PTA. He states that he has associated SOB. He reports he has been having some near syncope dizziness that started 4 days ago. He reports some sleep disturbance due to the pain going on for months. He reports some difficulty ambulating due to his usual weakness and paralysis.   He reports having episodes of generalized paralysis with one occuring this morning. He reports having episodes 3 times a week going on about a year. He states that he has been in jail for 2 days, and the last time he took any HTN medication was also 2 days ago. He reports that he has a hx of smoking. He denies any other drug use. He denies any new weakness.     [facility]  Past Medical History  Diagnosis Date  . Back pain   . Hypertension   . Hypercholesteremia   . Migraine   . Asthma   . Hip joint pain Since 2012   Past Surgical History  Procedure Laterality Date  . Incision and drainage     Family History  Problem Relation Age of Onset  . Asthma Sister   . Asthma Brother   . CAD Neg Hx   . Stroke Neg Hx    History   Substance Use Topics  . Smoking status: Former Smoker -- 0.25 packs/day    Types: Cigarettes    Start date: 01/14/2014  . Smokeless tobacco: Not on file     Comment: smoking 4-5 cigs per day  . Alcohol Use: No    Review of Systems  Respiratory: Positive for shortness of breath.   Cardiovascular: Positive for chest pain.  Neurological: Positive for dizziness. Negative for weakness.  Psychiatric/Behavioral: Positive for sleep disturbance.  All other systems reviewed and are negative.   Allergies  Flexeril; Motrin; and Naproxen  Home Medications   Prior to Admission medications   Medication Sig Start Date End Date Taking? Authorizing Provider  Alum & Mag Hydroxide-Simeth (MAGIC MOUTHWASH W/LIDOCAINE) SOLN Take 5 mLs by mouth 3 (three) times daily as needed for mouth pain. Swish and spit 03/16/14   Ambrose FinlandValerie A Keck, NP  amLODipine (NORVASC) 10 MG tablet Take 1 tablet (10 mg total) by mouth daily. 03/16/14   Ambrose FinlandValerie A Keck, NP  hydrochlorothiazide (HYDRODIURIL) 25 MG tablet Take 1 tablet (25 mg total) by mouth daily. 02/20/14   Abigail Harris, PA-C   BP 130/91 mmHg  Pulse 95  Temp(Src) 98.9 F (37.2 C) (Oral)  Resp 12  Ht 5\' 5"  (1.651 m)  Wt 120 lb (54.432 kg)  BMI  19.97 kg/m2  SpO2 100% Physical Exam  Constitutional: He is oriented to person, place, and time. He appears well-developed and well-nourished. He appears distressed (anxious appearing).  HENT:  Head: Normocephalic and atraumatic.  Right Ear: External ear normal.  Left Ear: External ear normal.  Nose: Nose normal.  Mouth/Throat: Oropharynx is clear and moist.  Patient is edentulous  Eyes: Conjunctivae and EOM are normal. Pupils are equal, round, and reactive to light.  Neck: Normal range of motion. Neck supple. No JVD present. No tracheal deviation present. No thyromegaly present.  Cardiovascular: Normal rate, regular rhythm, normal heart sounds and intact distal pulses.  Exam reveals no gallop and no friction rub.    No murmur heard. Pulmonary/Chest: Effort normal and breath sounds normal. No stridor. No respiratory distress. He has no wheezes. He has no rales. He exhibits no tenderness.  Abdominal: Soft. Bowel sounds are normal. He exhibits no distension and no mass. There is no tenderness. There is no rebound and no guarding.  Musculoskeletal: Normal range of motion. He exhibits no edema or tenderness.  Lymphadenopathy:    He has no cervical adenopathy.  Neurological: He is alert and oriented to person, place, and time. He displays normal reflexes. No cranial nerve deficit. He exhibits normal muscle tone. Coordination normal.  Skin: Skin is warm and dry. No rash noted. No erythema. No pallor.  Psychiatric: He has a normal mood and affect. His behavior is normal. Judgment and thought content normal.  Nursing note and vitals reviewed.   ED Course  Procedures (including critical care time) DIAGNOSTIC STUDIES: Oxygen Saturation is 100% on RA, normal by my interpretation.    COORDINATION OF CARE: 11:24 PM- Pt advised of plan for treatment which includes medication, radiology, and labs and pt agrees.  Labs Review Labs Reviewed  CBC WITH DIFFERENTIAL - Abnormal; Notable for the following:    Hemoglobin 12.6 (*)    HCT 38.0 (*)    All other components within normal limits  TROPONIN I  BASIC METABOLIC PANEL    Imaging Review Dg Chest 2 View  05/01/2014   CLINICAL DATA:  Chest pain, shortness of breath  EXAM: CHEST  2 VIEW  COMPARISON:  02/20/2014  FINDINGS: Lungs are essentially clear. No focal consolidation. No pleural effusion or pneumothorax.  The heart is normal in size.  Visualized osseous structures are within normal limits.  IMPRESSION: No evidence of acute cardiopulmonary disease.   Electronically Signed   By: Charline BillsSriyesh  Krishnan M.D.   On: 05/01/2014 02:10     EKG Interpretation None      Date: 04/30/2014  Rate: 98  Rhythm: normal sinus rhythm  QRS Axis: normal  Intervals: normal   ST/T Wave abnormalities: LVH  Conduction Disutrbances:none  Narrative Interpretation:   Old EKG Reviewed: unchanged   MDM   Final diagnoses:  Essential hypertension  Chronic chest pain  Poor compliance with medication   38 year old male who presents from jail with complaint of chest pain and intermittent paralysis.  Symptoms are chronic and have been ongoing for some time.  Chest pain may be due to reflux, will start him on medications for same.  I'm unsure the etiology of his periodic paralysis, potassium today normal.  May be stress-induced.  He readily admits that he has not been taking his blood pressure medication.  We'll restart him on his prior medications.  No signs of ischemia on EKG or lab work.  Patient advised that he needs to take his medications to prevent heart disease.  Recent echo with LVH, but no signs of CHF.  Patient is cleared for return to jail.  I personally performed the services described in this documentation, which was scribed in my presence. The recorded information has been reviewed and is accurate.      Olivia Mackie, MD 05/01/14 [phone]

## 2014-04-30 NOTE — ED Notes (Signed)
Patient from jail, complaining of chest pain and shortness of breath for the last three days, with hypertension today.  Patient has been hypertensive with EMS, 190/136.  Patient is complaining of HA.

## 2014-05-01 ENCOUNTER — Emergency Department (HOSPITAL_COMMUNITY): Payer: Medicaid Other

## 2014-05-01 MED ORDER — AMLODIPINE BESYLATE 10 MG PO TABS: 10.0000 mg | ORAL_TABLET | Freq: Every day | ORAL | Status: AC

## 2014-05-01 MED ORDER — HYDROCHLOROTHIAZIDE 25 MG PO TABS
25.0000 mg | ORAL_TABLET | Freq: Every day | ORAL | Status: DC
  Administered 2014-05-01: 25 mg via ORAL

## 2014-05-01 MED ORDER — GI COCKTAIL ~~LOC~~
30.0000 mL | Freq: Once | ORAL | Status: AC
  Administered 2014-05-01: 30 mL via ORAL

## 2014-05-01 MED ORDER — ACETAMINOPHEN 325 MG PO TABS
650.0000 mg | ORAL_TABLET | Freq: Once | ORAL | Status: AC
  Administered 2014-05-01: 650 mg via ORAL

## 2014-05-01 MED ORDER — HYDROCHLOROTHIAZIDE 25 MG PO TABS: 25.0000 mg | ORAL_TABLET | Freq: Every day | ORAL | Status: AC

## 2014-05-01 MED ORDER — AMLODIPINE BESYLATE 5 MG PO TABS
10.0000 mg | ORAL_TABLET | Freq: Every day | ORAL | Status: DC
  Administered 2014-05-01: 10 mg via ORAL

## 2014-05-01 MED ORDER — FAMOTIDINE 20 MG PO TABS
20.0000 mg | ORAL_TABLET | Freq: Two times a day (BID) | ORAL | Status: DC
  Administered 2014-05-01: 20 mg via ORAL

## 2014-05-01 MED ORDER — FAMOTIDINE 20 MG PO TABS: 20.0000 mg | ORAL_TABLET | Freq: Two times a day (BID) | ORAL | Status: AC

## 2014-05-01 NOTE — Discharge Instructions (Signed)
It is important for you to take medications as prescribed.  Follow-up with your doctor for recheck when you're out of jail.   Chest Pain (Nonspecific) It is often hard to give a specific diagnosis for the cause of chest pain. There is always a chance that your pain could be related to something serious, such as a heart attack or a blood clot in the lungs. You need to follow up with your health care provider for further evaluation. CAUSES   Heartburn.  Pneumonia or bronchitis.  Anxiety or stress.  Inflammation around your heart (pericarditis) or lung (pleuritis or pleurisy).  A blood clot in the lung.  A collapsed lung (pneumothorax). It can develop suddenly on its own (spontaneous pneumothorax) or from trauma to the chest.  Shingles infection (herpes zoster virus). The chest wall is composed of bones, muscles, and cartilage. Any of these can be the source of the pain.  The bones can be bruised by injury.  The muscles or cartilage can be strained by coughing or overwork.  The cartilage can be affected by inflammation and become sore (costochondritis). DIAGNOSIS  Lab tests or other studies may be needed to find the cause of your pain. Your health care provider may have you take a test called an ambulatory electrocardiogram (ECG). An ECG records your heartbeat patterns over a 24-hour period. You may also have other tests, such as:  Transthoracic echocardiogram (TTE). During echocardiography, sound waves are used to evaluate how blood flows through your heart.  Transesophageal echocardiogram (TEE).  Cardiac monitoring. This allows your health care provider to monitor your heart rate and rhythm in real time.  Holter monitor. This is a portable device that records your heartbeat and can help diagnose heart arrhythmias. It allows your health care provider to track your heart activity for several days, if needed.  Stress tests by exercise or by giving medicine that makes the heart beat  faster. TREATMENT   Treatment depends on what may be causing your chest pain. Treatment may include:  Acid blockers for heartburn.  Anti-inflammatory medicine.  Pain medicine for inflammatory conditions.  Antibiotics if an infection is present.  You may be advised to change lifestyle habits. This includes stopping smoking and avoiding alcohol, caffeine, and chocolate.  You may be advised to keep your head raised (elevated) when sleeping. This reduces the chance of acid going backward from your stomach into your esophagus. Most of the time, nonspecific chest pain will improve within 2-3 days with rest and mild pain medicine.  HOME CARE INSTRUCTIONS   If antibiotics were prescribed, take them as directed. Finish them even if you start to feel better.  For the next few days, avoid physical activities that bring on chest pain. Continue physical activities as directed.  Do not use any tobacco products, including cigarettes, chewing tobacco, or electronic cigarettes.  Avoid drinking alcohol.  Only take medicine as directed by your health care provider.  Follow your health care provider's suggestions for further testing if your chest pain does not go away.  Keep any follow-up appointments you made. If you do not go to an appointment, you could develop lasting (chronic) problems with pain. If there is any problem keeping an appointment, call to reschedule. SEEK MEDICAL CARE IF:   Your chest pain does not go away, even after treatment.  You have a rash with blisters on your chest.  You have a fever. SEEK IMMEDIATE MEDICAL CARE IF:   You have increased chest pain or pain that  spreads to your arm, neck, jaw, back, or abdomen.  You have shortness of breath.  You have an increasing cough, or you cough up blood.  You have severe back or abdominal pain.  You feel nauseous or vomit.  You have severe weakness.  You faint.  You have chills. This is an emergency. Do not wait to  see if the pain will go away. Get medical help at once. Call your local emergency services (911 in [place] Do not drive yourself to the hospital. MAKE SURE YOU:   Understand these instructions.  Will watch your condition.  Will get help right away if you are not doing well or get worse. Document Released: 02/21/2005 Document Revised: 05/19/2013 Document Reviewed: 12/18/2007 [facility] Patient Information 2015 [place], [place]. This information is not intended to replace advice given to you by your health care provider. Make sure you discuss any questions you have with your health care provider.  DASH Eating Plan DASH stands for "Dietary Approaches to Stop Hypertension." The DASH eating plan is a healthy eating plan that has been shown to reduce high blood pressure (hypertension). Additional health benefits may include reducing the risk of type 2 diabetes mellitus, heart disease, and stroke. The DASH eating plan may also help with weight loss. WHAT DO I NEED TO KNOW ABOUT THE DASH EATING PLAN? For the DASH eating plan, you will follow these general guidelines:  Choose foods with a percent daily value for sodium of less than 5% (as listed on the food label).  Use salt-free seasonings or herbs instead of table salt or sea salt.  Check with your health care provider or pharmacist before using salt substitutes.  Eat lower-sodium products, often labeled as "lower sodium" or "no salt added."  Eat fresh foods.  Eat more vegetables, fruits, and low-fat dairy products.  Choose whole grains. Look for the word "whole" as the first word in the ingredient list.  Choose fish and skinless chicken or [place] more often than red meat. Limit fish, poultry, and meat to 6 oz (170 g) each day.  Limit sweets, desserts, sugars, and sugary drinks.  Choose heart-healthy fats.  Limit cheese to 1 oz (28 g) per day.  Eat more home-cooked food and less restaurant, buffet, and fast food.  Limit fried  foods.  Cook foods using methods other than frying.  Limit canned vegetables. If you do use them, rinse them well to decrease the sodium.  When eating at a restaurant, ask that your food be prepared with less salt, or no salt if possible. WHAT FOODS CAN I EAT? Seek help from a dietitian for individual calorie needs. Grains Whole grain or whole wheat bread. Brown rice. Whole grain or whole wheat pasta. Quinoa, bulgur, and whole grain cereals. Low-sodium cereals. Corn or whole wheat flour tortillas. Whole grain cornbread. Whole grain crackers. Low-sodium crackers. Vegetables Fresh or frozen vegetables (raw, steamed, roasted, or grilled). Low-sodium or reduced-sodium tomato and vegetable juices. Low-sodium or reduced-sodium tomato sauce and paste. Low-sodium or reduced-sodium canned vegetables.  Fruits All fresh, canned (in natural juice), or frozen fruits. Meat and Other Protein Products Ground beef (85% or leaner), grass-fed beef, or beef trimmed of fat. Skinless chicken or [place]key. Ground chicken or [place]key. Pork trimmed of fat. All fish and seafood. Eggs. Dried beans, peas, or lentils. Unsalted nuts and seeds. Unsalted canned beans. Dairy Low-fat dairy products, such as skim or 1% milk, 2% or reduced-fat cheeses, low-fat ricotta or cottage cheese, or plain low-fat yogurt. Low-sodium or reduced-sodium  cheeses. Fats and Oils Tub margarines without trans fats. Light or reduced-fat mayonnaise and salad dressings (reduced sodium). Avocado. Safflower, olive, or canola oils. Natural peanut or almond butter. Other Unsalted popcorn and pretzels. The items listed above may not be a complete list of recommended foods or beverages. Contact your dietitian for more options. WHAT FOODS ARE NOT RECOMMENDED? Grains White bread. White pasta. White rice. Refined cornbread. Bagels and croissants. Crackers that contain trans fat. Vegetables Creamed or fried vegetables. Vegetables in a cheese sauce. Regular  canned vegetables. Regular canned tomato sauce and paste. Regular tomato and vegetable juices. Fruits Dried fruits. Canned fruit in light or heavy syrup. Fruit juice. Meat and Other Protein Products Fatty cuts of meat. Ribs, chicken wings, bacon, sausage, bologna, salami, chitterlings, fatback, hot dogs, bratwurst, and packaged luncheon meats. Salted nuts and seeds. Canned beans with salt. Dairy Whole or 2% milk, cream, half-and-half, and cream cheese. Whole-fat or sweetened yogurt. Full-fat cheeses or blue cheese. Nondairy creamers and whipped toppings. Processed cheese, cheese spreads, or cheese curds. Condiments Onion and garlic salt, seasoned salt, table salt, and sea salt. Canned and packaged gravies. Worcestershire sauce. Tartar sauce. Barbecue sauce. Teriyaki sauce. Soy sauce, including reduced sodium. Steak sauce. Fish sauce. Oyster sauce. Cocktail sauce. Horseradish. Ketchup and mustard. Meat flavorings and tenderizers. Bouillon cubes. Hot sauce. Tabasco sauce. Marinades. Taco seasonings. Relishes. Fats and Oils Butter, stick margarine, lard, shortening, ghee, and bacon fat. Coconut, palm kernel, or palm oils. Regular salad dressings. Other Pickles and olives. Salted popcorn and pretzels. The items listed above may not be a complete list of foods and beverages to avoid. Contact your dietitian for more information. WHERE CAN I FIND MORE INFORMATION? National Heart, Lung, and Blood Institute: [employer] Document Released: 05/03/2011 Document Revised: 09/28/2013 Document Reviewed: 03/18/2013 [facility] Patient Information 2015 [place], [place]. This information is not intended to replace advice given to you by your health care provider. Make sure you discuss any questions you have with your health care provider.  Hypertension Hypertension, commonly called high blood pressure, is when the force of blood pumping through your arteries is too strong. Your  arteries are the blood vessels that carry blood from your heart throughout your body. A blood pressure reading consists of a higher number over a lower number, such as 110/72. The higher number (systolic) is the pressure inside your arteries when your heart pumps. The lower number (diastolic) is the pressure inside your arteries when your heart relaxes. Ideally you want your blood pressure below 120/80. Hypertension forces your heart to work harder to pump blood. Your arteries may become narrow or stiff. Having hypertension puts you at risk for heart disease, stroke, and other problems.  RISK FACTORS Some risk factors for high blood pressure are controllable. Others are not.  Risk factors you cannot control include:   Race. You may be at higher risk if you are African American.  Age. Risk increases with age.  Gender. Men are at higher risk than women before age 11 years. After age 53, women are at higher risk than men. Risk factors you can control include:  Not getting enough exercise or physical activity.  Being overweight.  Getting too much fat, sugar, calories, or salt in your diet.  Drinking too much alcohol. SIGNS AND SYMPTOMS Hypertension does not usually cause signs or symptoms. Extremely high blood pressure (hypertensive crisis) may cause headache, anxiety, shortness of breath, and nosebleed. DIAGNOSIS  To check if you have hypertension, your health care provider will measure your  blood pressure while you are seated, with your arm held at the level of your heart. It should be measured at least twice using the same arm. Certain conditions can cause a difference in blood pressure between your right and left arms. A blood pressure reading that is higher than normal on one occasion does not mean that you need treatment. If one blood pressure reading is high, ask your health care provider about having it checked again. TREATMENT  Treating high blood pressure includes making lifestyle  changes and possibly taking medicine. Living a healthy lifestyle can help lower high blood pressure. You may need to change some of your habits. Lifestyle changes may include:  Following the DASH diet. This diet is high in fruits, vegetables, and whole grains. It is low in salt, red meat, and added sugars.  Getting at least 2 hours of brisk physical activity every week.  Losing weight if necessary.  Not smoking.  Limiting alcoholic beverages.  Learning ways to reduce stress. If lifestyle changes are not enough to get your blood pressure under control, your health care provider may prescribe medicine. You may need to take more than one. Work closely with your health care provider to understand the risks and benefits. HOME CARE INSTRUCTIONS  Have your blood pressure rechecked as directed by your health care provider.   Take medicines only as directed by your health care provider. Follow the directions carefully. Blood pressure medicines must be taken as prescribed. The medicine does not work as well when you skip doses. Skipping doses also puts you at risk for problems.   Do not smoke.   Monitor your blood pressure at home as directed by your health care provider. SEEK MEDICAL CARE IF:   You think you are having a reaction to medicines taken.  You have recurrent headaches or feel dizzy.  You have swelling in your ankles.  You have trouble with your vision. SEEK IMMEDIATE MEDICAL CARE IF:  You develop a severe headache or confusion.  You have unusual weakness, numbness, or feel faint.  You have severe chest or abdominal pain.  You vomit repeatedly.  You have trouble breathing. MAKE SURE YOU:   Understand these instructions.  Will watch your condition.  Will get help right away if you are not doing well or get worse. Document Released: 05/14/2005 Document Revised: 09/28/2013 Document Reviewed: 03/06/2013 [facility] Patient Information 2015 [place], [place]. This  information is not intended to replace advice given to you by your health care provider. Make sure you discuss any questions you have with your health care provider.

## 2014-05-01 NOTE — ED Notes (Signed)
Patient transported to X-ray 

## 2022-06-04 ENCOUNTER — Ambulatory Visit: Admit: 2022-06-04 | Discharge: 2022-06-04 | Payer: MEDICARE | Attending: Family

## 2022-06-04 DIAGNOSIS — L739 Follicular disorder, unspecified: Secondary | ICD-10-CM

## 2022-06-04 MED ORDER — ALBUTEROL SULFATE HFA 108 (90 BASE) MCG/ACT IN AERS
108 (90 Base) MCG/ACT | Freq: Four times a day (QID) | RESPIRATORY_TRACT | 0 refills | Status: AC | PRN
Start: 2022-06-04 — End: ?

## 2022-06-04 MED ORDER — CEPHALEXIN 500 MG PO CAPS
500 MG | ORAL_CAPSULE | Freq: Four times a day (QID) | ORAL | 0 refills | Status: AC
Start: 2022-06-04 — End: 2022-06-14

## 2022-06-04 NOTE — Progress Notes (Unsigned)
Seth Green (DOB:  1976/03/11) is a 47 y.o. male,New patient, here for evaluation of the following chief complaint(s):  Rash (C/o rash or allergic reaction on back. Sx 2 days. Sx consist of itching. Also requesting asthma pump.) and New Patient      ASSESSMENT/PLAN:  Visit Diagnoses and Associated Orders       Folliculitis    -  Primary    cephALEXin (KEFLEX) 500 MG capsule [9500]           Mild intermittent asthma without complication        albuterol sulfate HFA (VENTOLIN HFA) 108 (90 Base) MCG/ACT inhaler [17837]      [facility] [place] [[id number] Custom]           Essential hypertension        [facility] [place] [place] [[id number] Custom]                          Follow up in *** days if symptoms persist or if symptoms worsen.    SUBJECTIVE/OBJECTIVE:  HPI     47 y.o. male presents with symptoms of pruritic rash to back for few days.  Started after switching laundry detergent.  No tx to date.    Needs albuterol mdi RF for asthma.  Nonsmoker    Also out of BP meds for one year.  Last PCP one year ago.  Looking for new PCP.  Use to take HCTZ 12.5mg  daily     Works as Programmer, multimedia which requires frequent travel         Vitals:    06/04/22 1638 06/04/22 1718   BP: (!) 162/116 (!) 160/110   Site: Right Upper Arm Left Upper Arm   Position: Sitting Sitting   Cuff Size: Medium Adult Medium Adult   Pulse: (!) 112    Resp: 18    Temp: 99 F (37.2 C)    TempSrc: Temporal    SpO2: 99%    Weight: 53.5 kg (118 lb)        No results found for this visit on 06/04/22.     Physical Exam         An electronic signature was used to authenticate this note.    Benny Lennert, APRN - NP

## 2022-06-26 ENCOUNTER — Encounter

## 2022-10-10 ENCOUNTER — Emergency Department: Admit: 2022-10-10 | Payer: Auto Insurance (includes no fault)

## 2022-10-10 ENCOUNTER — Inpatient Hospital Stay: Admit: 2022-10-10 | Discharge: 2022-10-10 | Disposition: A | Discharge status: Home / Self Care | Payer: MEDICAID

## 2022-10-10 DIAGNOSIS — S20221A Contusion of right back wall of thorax, initial encounter: Secondary | ICD-10-CM

## 2022-10-10 MED ORDER — CYCLOBENZAPRINE HCL 5 MG PO TABS
5 | ORAL_TABLET | Freq: Three times a day (TID) | ORAL | 0 refills | Status: AC | PRN
Start: 2022-10-10 — End: 2022-10-20

## 2022-10-10 NOTE — ED Notes (Signed)
Pt ambulatory out with paperwork in hand. DC instructions given.
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
## 2022-10-24 ENCOUNTER — Ambulatory Visit: Admit: 2022-10-24 | Discharge: 2022-10-24 | Payer: MEDICARE

## 2022-10-24 DIAGNOSIS — M545 Low back pain, unspecified: Secondary | ICD-10-CM

## 2022-10-24 MED ORDER — KETOROLAC TROMETHAMINE 30 MG/ML IJ SOLN
30 | Freq: Once | INTRAMUSCULAR | Status: AC
Start: 2022-10-24 — End: 2022-10-24
  Administered 2022-10-24: 19:00:00 30 mg via INTRAMUSCULAR

## 2022-10-24 MED ORDER — HYDROCHLOROTHIAZIDE 12.5 MG PO CAPS
12.5 MG | ORAL_CAPSULE | Freq: Every morning | ORAL | 1 refills | Status: AC
Start: 2022-10-24 — End: ?

## 2022-10-24 MED ORDER — NAPROXEN 500 MG PO TABS
500 MG | ORAL_TABLET | Freq: Two times a day (BID) | ORAL | 0 refills | Status: AC | PRN
Start: 2022-10-24 — End: ?

## 2022-10-24 NOTE — Patient Instructions (Signed)
Thank you for visiting [facility] today.    If you develop a fever, gross inability to walk/weakness, severe numbness in bilateral lower extremities, saddle paresthesias, and/or loss of bladder/bowel control, please go to the nearest emergency department for concern of cauda equina.    -Naproxen as prescribed  -Alternate between ice and heat.  Heat may be applied for 30 minutes at a time every 4-5 hours.  Take warm epsom salt baths and gentle stretches.  -Lidocaine patches for 12 hours on and 12 hours off.  -Alternate Ibuprofen and Tylenol  -Increase water hydration.  -Avoid lifting heavy objects.  -Back exercises once pain has been controlled    Please follow up with your primary care provider within 2-3 days if  your signs and symptoms have not resolved or worsened.    Please go immediately to the Emergency Department if you develop loss of bowel or bladder function, peripheral numbness/weakness/tingling, shortness of breath, chest pain, and significant fevers above 100.44F.
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
## 2023-01-24 ENCOUNTER — Encounter

## 2023-05-23 ENCOUNTER — Ambulatory Visit: Admit: 2023-05-23 | Discharge: 2023-05-24 | Payer: Medicare (Managed Care)

## 2023-05-23 VITALS — BP 200/107 | HR 92 | Resp 16 | Wt 117.0 lb

## 2023-05-23 DIAGNOSIS — U071 COVID-19: Secondary | ICD-10-CM

## 2023-05-23 NOTE — Progress Notes (Signed)
 Patient Name: Seth Green   Date of Birth:  1975/06/30   Patient Status: Established patient,   Chief Complaint: Pharyngitis (Cough, sore throat, abdominal pain. /X4 days  )      __________________________________________________________________________

## 2023-05-23 NOTE — Patient Instructions (Signed)
 Viral Covid does not require antibiotic as they do not treat / help viral illnesses. Viral symptoms will usually improve over the next week, but lingering cough or congestion can take several weeks to completely resolve - this is normal.   To prevent dehyd

## 2023-05-24 LAB — POCT COVID-19, ANTIGEN
Lot Number: 899233
SARS-COV-2, POC: DETECTED — AB

## 2023-05-24 LAB — POCT INFLUENZA A/B ANTIGEN
Inflenza A Ag: NEGATIVE
Influenza B Ag: NEGATIVE

## 2023-05-24 LAB — POCT RAPID STREP A: Strep A Ag: NOT DETECTED

## 2023-05-24 MED ORDER — BENZONATATE 200 MG PO CAPS
200 | ORAL_CAPSULE | Freq: Three times a day (TID) | ORAL | 0 refills | Status: AC | PRN
Start: 2023-05-24 — End: 2023-06-02

## 2023-05-24 MED ORDER — ALBUTEROL SULFATE HFA 108 (90 BASE) MCG/ACT IN AERS
108 | Freq: Four times a day (QID) | RESPIRATORY_TRACT | 3 refills | Status: AC | PRN
Start: 2023-05-24 — End: ?

## 2023-12-11 ENCOUNTER — Ambulatory Visit: Admit: 2023-12-11 | Discharge: 2023-12-11 | Payer: Medicaid (Managed Care) | Attending: Family

## 2023-12-11 VITALS — BP 161/111 | HR 119 | Temp 98.40000°F | Resp 16 | Wt 116.0 lb

## 2023-12-11 DIAGNOSIS — J4520 Mild intermittent asthma, uncomplicated: Principal | ICD-10-CM

## 2023-12-11 MED ORDER — COMPRESSOR/NEBULIZER MISC
0 refills | Status: AC | PRN
Start: 2023-12-11 — End: ?

## 2023-12-11 MED ORDER — TRIAMCINOLONE ACETONIDE 0.5 % EX CREA
0.5 | CUTANEOUS | 0 refills | Status: AC
Start: 2023-12-11 — End: 2023-12-18

## 2023-12-11 MED ORDER — IPRATROPIUM-ALBUTEROL 0.5-2.5 (3) MG/3ML IN SOLN
0.5-2.5 | Freq: Once | RESPIRATORY_TRACT | Status: AC
Start: 2023-12-11 — End: 2023-12-11
  Administered 2023-12-11: 20:00:00 1 via RESPIRATORY_TRACT

## 2023-12-11 MED ORDER — HYDROCHLOROTHIAZIDE 12.5 MG PO CAPS
12.5 | ORAL_CAPSULE | Freq: Every morning | ORAL | 1 refills | Status: AC
Start: 2023-12-11 — End: ?

## 2023-12-11 MED ORDER — ALBUTEROL SULFATE (2.5 MG/3ML) 0.083% IN NEBU
Freq: Four times a day (QID) | RESPIRATORY_TRACT | 3 refills | 25.00000 days | Status: AC | PRN
Start: 2023-12-11 — End: ?

## 2023-12-11 MED ORDER — NEBULIZER MASK ADULT/TUBING MISC
0 refills | Status: AC | PRN
Start: 2023-12-11 — End: ?

## 2023-12-11 MED ORDER — PREDNISONE 20 MG PO TABS
20 | ORAL_TABLET | Freq: Every day | ORAL | 0 refills | Status: AC
Start: 2023-12-11 — End: 2023-12-16

## 2023-12-11 MED ORDER — ALBUTEROL SULFATE HFA 108 (90 BASE) MCG/ACT IN AERS
108 | Freq: Four times a day (QID) | RESPIRATORY_TRACT | 0 refills | 25.00000 days | Status: AC | PRN
Start: 2023-12-11 — End: ?

## 2023-12-11 NOTE — Progress Notes (Signed)
 Seth Green (DOB:  11-Jun-1975) is a 48 y.o. male,Established patient, here for evaluation of the following chief complaint(s):  Rash (Rash to trunk and Bilateral arms x1 week), Shortness of Breath (SOB x2 days. History of asthma. Needs refill on Albuterol  inhaler. ), and Headache (Headaches intermittently x2 days. Ran out of BP medication months ago. Episodes of feeling drained and near syncope. History of seizures. Also needs referral for PCP, cardiology, and neurology)      Assessment & Plan :  Visit Diagnoses and Associated Orders         Mild intermittent asthma without complication    -  Primary    ipratropium 0.5 mg-albuterol  2.5 mg (DUONEB) nebulizer solution 1 Dose [93931]      [facility], [facility] [address], [address] [phone] [place]]      albuterol  sulfate HFA (VENTOLIN  HFA) 108 (90 Base) MCG/ACT inhaler [17837]      predniSONE (DELTASONE) 20 MG tablet [6496]      Nebulizers (COMPRESSOR/NEBULIZER) MISC [80782]      Respiratory Therapy Supplies (NEBULIZER MASK ADULT/TUBING) MISC [830256]      albuterol  (PROVENTIL ) (2.5 MG/3ML) 0.083% nebulizer solution [250]             History of seizures        [facility] - Rolfe Millman, DO, Neurology, [place] ([address]) [phone] Custom]      [facility], [facility] [address]) [place] Custom]             History of heart attack        [facility] - Vicky, Wanda, MD, Cardiology,  ([address] [[id number] Custom]      [facility] - [facility], [facility] [address]) [[id number] Custom]             Primary hypertension        hydroCHLOROthiazide  12.5 MG capsule [19146]             Rash        triamcinolone (ARISTOCORT) 0.5 % cream [8114]      predniSONE (DELTASONE) 20 MG tablet [6496]                 Rash: Possibly from a switch to gain detergent.  Will switch back and see if it improves    Triamcinolone cream and/or Benadryl cream to areas of rash to help with  itching    Prednisone 20 mg tabs: 2 pills once daily x 5 days to help with the rash as well as will help with wheezing and asthma    For his asthma:  He was given a DuoNeb in clinic, wheezing improved  Prescription sent for nebulizer, tubing, and medication  Prescription for albuterol  inhaler sent    For hypertension and history of MI:  Restart hydrochlorothiazide  for blood pressure      Referrals given to neurology, cardiology, and primary care           Subjective :    Rash  Associated symptoms include shortness of breath.   Shortness of Breath  Associated symptoms include headaches and a rash.   Headache       48 y.o. male presents with rash to arms, abdomen, back and chest x 1 week.  The rash is pruritic.  He reports that they somewhat recently switched detergents to gain detergent.  No new medications or lotions.  No pets in the home or animals in  the home.  Has not been doing yard work or spending time outside.    He also reports having shortness of breath and some wheezing for the past 2 days.  He does have a history of asthma.  He does not have an albuterol  inhaler, and his nebulizer machine is broken.    He has also been having headaches for the past 2 days.  He ran out of his blood pressure medication and has not been taking anything for this.  He states he has a history of a heart attack and is requesting a referral to cardiology    He also report reports that he has a history of seizures, has not been taking seizure prevention medications.  Last seizure was over a year ago    Vitals:    12/11/23 1529 12/11/23 1537   BP: (!) 165/98 (!) 161/111   BP Site: Left Upper Arm Left Upper Arm   Patient Position: Sitting Sitting   BP Cuff Size: Medium Adult Medium Adult   Pulse: (!) 119    Resp: 16    Temp: 98.4 F (36.9 C)    SpO2: 100%    Weight: 52.6 kg (116 lb)        No results found for this visit on 12/11/23.      Objective   Physical Exam  Constitutional:       General: He is not in acute distress.      Appearance: Normal appearance. He is not ill-appearing or toxic-appearing.   HENT:      Head: Normocephalic.      Nose: Nose normal.   Eyes:      Conjunctiva/sclera: Conjunctivae normal.   Cardiovascular:      Rate and Rhythm: Regular rhythm. Tachycardia present.   Pulmonary:      Effort: Pulmonary effort is normal. No respiratory distress.      Breath sounds: No stridor. Wheezing present. No rhonchi.   Skin:     General: Skin is warm and dry.      Comments: Very fine maculopapular rash on abdomen and back, some on forearms   Neurological:      Mental Status: He is alert and oriented to person, place, and time.   Psychiatric:         Behavior: Behavior normal.               An electronic signature was used to authenticate this note.    Amelie [name], ACNP

## 2023-12-11 NOTE — Patient Instructions (Signed)
 Rash: Possibly from a switch to gain detergent.  Will switch back and see if it improves    Triamcinolone cream and/or Benadryl cream to areas of rash to help with itching    Prednisone 20 mg tabs: 2 pills once daily x 5 days to help with the rash as well as will help with wheezing and asthma    Asthma: Prescription sent for nebulizer, tubing, and medication.  Prescription for albuterol  inhaler sent    High Blood Pressure:  Restart hydrochlorothiazide  for blood pressure    Referrals given to neurology, cardiology, and primary care

## 2024-04-28 ENCOUNTER — Ambulatory Visit: Payer: Medicaid (Managed Care) | Attending: Internal Medicine

## 2024-05-01 ENCOUNTER — Ambulatory Visit: Payer: Medicaid (Managed Care) | Attending: Internal Medicine

## 2024-05-22 NOTE — Telephone Encounter (Signed)
 Called patient could not leave vm due to mail box being full.

## 2024-05-25 ENCOUNTER — Ambulatory Visit: Payer: Medicaid (Managed Care) | Attending: Neurology
